# Patient Record
Sex: Female | Born: 1940 | Race: White | Hispanic: No | Marital: Single | Smoking: Former smoker
Health system: Southern US, Community
[De-identification: ages and names within clinical notes are randomized; demographics above are authoritative.]

## PROBLEM LIST (undated history)

## (undated) DIAGNOSIS — C189 Malignant neoplasm of colon, unspecified: Secondary | ICD-10-CM

## (undated) HISTORY — PX: COLON SURGERY: SHX602

---

## 2018-09-14 DIAGNOSIS — E039 Hypothyroidism, unspecified: Secondary | ICD-10-CM | POA: Insufficient documentation

## 2018-09-14 DIAGNOSIS — I10 Essential (primary) hypertension: Secondary | ICD-10-CM | POA: Insufficient documentation

## 2018-09-14 DIAGNOSIS — M109 Gout, unspecified: Secondary | ICD-10-CM | POA: Insufficient documentation

## 2018-09-14 DIAGNOSIS — Z9181 History of falling: Secondary | ICD-10-CM | POA: Insufficient documentation

## 2018-09-14 DIAGNOSIS — D126 Benign neoplasm of colon, unspecified: Secondary | ICD-10-CM | POA: Insufficient documentation

## 2018-09-28 DIAGNOSIS — C183 Malignant neoplasm of hepatic flexure: Secondary | ICD-10-CM | POA: Insufficient documentation

## 2018-10-01 DIAGNOSIS — Z8659 Personal history of other mental and behavioral disorders: Secondary | ICD-10-CM | POA: Insufficient documentation

## 2018-10-01 DIAGNOSIS — M47812 Spondylosis without myelopathy or radiculopathy, cervical region: Secondary | ICD-10-CM | POA: Insufficient documentation

## 2018-10-01 DIAGNOSIS — E669 Obesity, unspecified: Secondary | ICD-10-CM | POA: Insufficient documentation

## 2018-10-01 DIAGNOSIS — M797 Fibromyalgia: Secondary | ICD-10-CM | POA: Insufficient documentation

## 2018-10-01 DIAGNOSIS — R52 Pain, unspecified: Secondary | ICD-10-CM | POA: Insufficient documentation

## 2018-10-01 DIAGNOSIS — M4722 Other spondylosis with radiculopathy, cervical region: Secondary | ICD-10-CM | POA: Insufficient documentation

## 2018-10-01 DIAGNOSIS — G4730 Sleep apnea, unspecified: Secondary | ICD-10-CM | POA: Insufficient documentation

## 2018-10-23 ENCOUNTER — Encounter (HOSPITAL_COMMUNITY): Payer: Self-pay | Admitting: Emergency Medicine

## 2018-10-23 ENCOUNTER — Emergency Department (HOSPITAL_COMMUNITY)
Admission: EM | Admit: 2018-10-23 | Discharge: 2018-10-23 | Disposition: A | Discharge status: Home / Self Care | Payer: Medicare Other | Attending: Emergency Medicine | Admitting: Emergency Medicine

## 2018-10-23 ENCOUNTER — Emergency Department (HOSPITAL_COMMUNITY): Payer: Medicare Other

## 2018-10-23 ENCOUNTER — Other Ambulatory Visit: Payer: Self-pay

## 2018-10-23 DIAGNOSIS — R509 Fever, unspecified: Secondary | ICD-10-CM | POA: Insufficient documentation

## 2018-10-23 DIAGNOSIS — Z85038 Personal history of other malignant neoplasm of large intestine: Secondary | ICD-10-CM | POA: Insufficient documentation

## 2018-10-23 DIAGNOSIS — Z20828 Contact with and (suspected) exposure to other viral communicable diseases: Secondary | ICD-10-CM | POA: Diagnosis not present

## 2018-10-23 DIAGNOSIS — Z79899 Other long term (current) drug therapy: Secondary | ICD-10-CM | POA: Insufficient documentation

## 2018-10-23 DIAGNOSIS — M791 Myalgia, unspecified site: Secondary | ICD-10-CM | POA: Diagnosis not present

## 2018-10-23 HISTORY — DX: Malignant neoplasm of colon, unspecified: C18.9

## 2018-10-23 LAB — URINALYSIS, ROUTINE W REFLEX MICROSCOPIC
Bilirubin Urine: NEGATIVE
Glucose, UA: NEGATIVE mg/dL
Hgb urine dipstick: NEGATIVE
Ketones, ur: NEGATIVE mg/dL
Leukocytes,Ua: NEGATIVE
Nitrite: NEGATIVE
Protein, ur: NEGATIVE mg/dL
Specific Gravity, Urine: 1.008 (ref 1.005–1.030)
pH: 5 (ref 5.0–8.0)

## 2018-10-23 LAB — CBC WITH DIFFERENTIAL/PLATELET
Abs Immature Granulocytes: 0.02 10*3/uL (ref 0.00–0.07)
Basophils Absolute: 0 10*3/uL (ref 0.0–0.1)
Basophils Relative: 0 %
Eosinophils Absolute: 0 10*3/uL (ref 0.0–0.5)
Eosinophils Relative: 1 %
HCT: 31.9 % — ABNORMAL LOW (ref 36.0–46.0)
Hemoglobin: 10.3 g/dL — ABNORMAL LOW (ref 12.0–15.0)
Immature Granulocytes: 0 %
Lymphocytes Relative: 18 %
Lymphs Abs: 1.4 10*3/uL (ref 0.7–4.0)
MCH: 31.9 pg (ref 26.0–34.0)
MCHC: 32.3 g/dL (ref 30.0–36.0)
MCV: 98.8 fL (ref 80.0–100.0)
Monocytes Absolute: 0.8 10*3/uL (ref 0.1–1.0)
Monocytes Relative: 11 %
Neutro Abs: 5.5 10*3/uL (ref 1.7–7.7)
Neutrophils Relative %: 70 %
Platelets: 434 10*3/uL — ABNORMAL HIGH (ref 150–400)
RBC: 3.23 MIL/uL — ABNORMAL LOW (ref 3.87–5.11)
RDW: 13.2 % (ref 11.5–15.5)
WBC: 7.7 10*3/uL (ref 4.0–10.5)
nRBC: 0 % (ref 0.0–0.2)

## 2018-10-23 LAB — COMPREHENSIVE METABOLIC PANEL
ALT: 13 U/L (ref 0–44)
AST: 15 U/L (ref 15–41)
Albumin: 3.1 g/dL — ABNORMAL LOW (ref 3.5–5.0)
Alkaline Phosphatase: 50 U/L (ref 38–126)
Anion gap: 16 — ABNORMAL HIGH (ref 5–15)
BUN: 11 mg/dL (ref 8–23)
CO2: 21 mmol/L — ABNORMAL LOW (ref 22–32)
Calcium: 9.2 mg/dL (ref 8.9–10.3)
Chloride: 99 mmol/L (ref 98–111)
Creatinine, Ser: 0.88 mg/dL (ref 0.44–1.00)
GFR calc Af Amer: >60 mL/min (ref >60)
GFR calc non Af Amer: >60 mL/min (ref >60)
Glucose, Bld: 104 mg/dL — ABNORMAL HIGH (ref 70–99)
Potassium: 3.4 mmol/L — ABNORMAL LOW (ref 3.5–5.1)
Sodium: 136 mmol/L (ref 135–145)
Total Bilirubin: 0.6 mg/dL (ref 0.3–1.2)
Total Protein: 7 g/dL (ref 6.5–8.1)

## 2018-10-23 LAB — SARS CORONAVIRUS 2 BY RT PCR (HOSPITAL ORDER, PERFORMED IN ~~LOC~~ HOSPITAL LAB): SARS Coronavirus 2: NEGATIVE

## 2018-10-23 LAB — C-REACTIVE PROTEIN: CRP: 21.2 mg/dL — ABNORMAL HIGH (ref <1.0)

## 2018-10-23 LAB — LACTIC ACID, PLASMA
Lactic Acid, Venous: 1.4 mmol/L (ref 0.5–1.9)
Lactic Acid, Venous: 1 mmol/L (ref 0.5–1.9)

## 2018-10-23 LAB — LACTATE DEHYDROGENASE: LDH: 163 U/L (ref 98–192)

## 2018-10-23 LAB — D-DIMER, QUANTITATIVE: D-Dimer, Quant: 4.91 ug/mL-FEU — ABNORMAL HIGH (ref 0.00–0.50)

## 2018-10-23 LAB — TRIGLYCERIDES: Triglycerides: 84 mg/dL (ref <150)

## 2018-10-23 LAB — TROPONIN I: Troponin I: 0.03 ng/mL (ref <0.03)

## 2018-10-23 LAB — FERRITIN: Ferritin: 345 ng/mL — ABNORMAL HIGH (ref 11–307)

## 2018-10-23 LAB — FIBRINOGEN: Fibrinogen: 778 mg/dL — ABNORMAL HIGH (ref 210–475)

## 2018-10-23 LAB — PROCALCITONIN: Procalcitonin: <0.1 ng/mL

## 2018-10-23 MED ORDER — MORPHINE SULFATE (PF) 4 MG/ML IV SOLN
4.0000 mg | Freq: Once | INTRAVENOUS | Status: AC
  Administered 2018-10-23: 4 mg via INTRAVENOUS
  Filled 2018-10-23: qty 1

## 2018-10-23 MED ORDER — SODIUM CHLORIDE 0.9 % IV BOLUS
500.0000 mL | Freq: Once | INTRAVENOUS | Status: AC
  Administered 2018-10-23: 15:00:00 500 mL via INTRAVENOUS

## 2018-10-23 MED ORDER — SODIUM CHLORIDE 0.9 % IV SOLN: 1000.0000 mL | INTRAVENOUS | Status: DC

## 2018-10-23 MED ORDER — FUROSEMIDE 40 MG PO TABS
20.0000 mg | ORAL_TABLET | Freq: Every day | ORAL | Status: DC
  Administered 2018-10-23: 20 mg via ORAL
  Filled 2018-10-23: qty 1

## 2018-10-23 NOTE — ED Notes (Signed)
[facility] called to transport patient back to [facility]

## 2018-10-23 NOTE — ED Provider Notes (Signed)
[facility] EMERGENCY DEPARTMENT Provider Note   CSN: [id number] Arrival date & time: 10/23/18  1447    History   Chief Complaint Chief Complaint  Patient presents with  . Generalized edema    HPI Deborah Lane is a 78 y.o. female.  She was sent in from home by her visiting nurse for evaluation of fever.  She has a history of colon cancer and said she had a partial resection done at [facility] about 2 weeks ago.  She has been doing well from that and was staying with her son.  She said starting yesterday she began with myalgias and arthralgias.  No cough no shortness of breath no nausea vomiting or diarrhea.  She said she is had a little bit of urinary frequency.  No known Covid exposure.  She is tried nothing for it.     The history is provided by the patient.  Fever  Severity:  Unable to specify Onset quality:  Gradual Progression:  Unchanged Chronicity:  New Relieved by:  None tried Worsened by:  Movement Ineffective treatments:  None tried Associated symptoms: myalgias   Associated symptoms: no chills, no confusion, no congestion, no cough, no diarrhea, no dysuria, no ear pain, no headaches, no nausea, no rash, no rhinorrhea, no somnolence, no sore throat and no vomiting     No past medical history on file.  There are no active problems to display for this patient.   Past Surgical History:  Procedure Laterality Date  . COLON SURGERY       OB History   No obstetric history on file.      Home Medications    Prior to Admission medications   Medication Sig Start Date End Date Taking? Authorizing Provider  acetaminophen (TYLENOL) 325 MG tablet Take 325 mg by mouth daily as needed for mild pain.  10/18/18 10/28/18 Yes [provider]  allopurinol (ZYLOPRIM) 100 MG tablet Take 100 mg by mouth daily.  08/27/18  Yes [provider]  enoxaparin (LOVENOX) 40 MG/0.4ML injection Inject 40 mg into the skin. Every 21 days 10/19/18 11/09/18 Yes [provider]   furosemide (LASIX) 40 MG tablet Take 20 mg by mouth daily.    Yes [provider]  levothyroxine (SYNTHROID) 100 MCG tablet Take 100 mcg by mouth daily before breakfast.    Yes [provider]  lisinopril-hydrochlorothiazide (ZESTORETIC) 20-25 MG tablet Take 1 tablet by mouth daily.    Yes [provider]  polyethylene glycol (MIRALAX / GLYCOLAX) 17 g packet Take 17 g by mouth daily.    Yes [provider]  potassium chloride SA (K-DUR) 20 MEQ tablet Take 40 mEq by mouth daily.    Yes [provider]  senna-docusate (SENOKOT-S) 8.6-50 MG tablet Take 1 tablet by mouth daily.  10/18/18  Yes [provider]  simvastatin (ZOCOR) 40 MG tablet Take 40 mg by mouth daily at 6 PM.    Yes [provider]    Family History No family history on file.  Social History Social History   Tobacco Use  . Smoking status: Not on file  Substance Use Topics  . Alcohol use: Not on file  . Drug use: Not on file     Allergies   Patient has no allergy information on record.   Review of Systems Review of Systems  Constitutional: Positive for fever. Negative for chills.  HENT: Negative for congestion, ear pain, rhinorrhea and sore throat.   Eyes: Negative for visual disturbance.  Respiratory: Negative for cough.   Gastrointestinal: Negative for diarrhea, nausea and vomiting.  Genitourinary: Negative for dysuria.  Musculoskeletal: Positive for myalgias.  Skin: Negative for rash.  Neurological: Negative for headaches.  Psychiatric/Behavioral: Negative for confusion.     Physical Exam Updated Vital Signs There were no vitals taken for this visit.  Physical Exam Vitals signs and nursing note reviewed.  Constitutional:      General: She is not in acute distress.    Appearance: Normal appearance. She is well-developed.  HENT:     Head: Normocephalic and atraumatic.  Eyes:     Conjunctiva/sclera: Conjunctivae normal.  Neck:      Musculoskeletal: Neck supple.  Cardiovascular:     Rate and Rhythm: Normal rate and regular rhythm.     Heart sounds: No murmur.  Pulmonary:     Effort: Pulmonary effort is normal. No respiratory distress.     Breath sounds: Normal breath sounds.  Abdominal:     Palpations: Abdomen is soft.     Tenderness: There is no abdominal tenderness.  Musculoskeletal: Normal range of motion.        General: Tenderness (diffuse whole body) present. No deformity.  Skin:    General: Skin is warm and dry.     Capillary Refill: Capillary refill takes less than 2 seconds.  Neurological:     Mental Status: She is alert.     Sensory: No sensory deficit.     Motor: No weakness.      ED Treatments / Results  Labs (all labs ordered are listed, but only abnormal results are displayed) Labs Reviewed  COMPREHENSIVE METABOLIC PANEL - Abnormal; Notable for the following components:      Result Value   Potassium 3.4 (*)    CO2 21 (*)    Glucose, Bld 104 (*)    Albumin 3.1 (*)    Anion gap 16 (*)    All other components within normal limits  FERRITIN - Abnormal; Notable for the following components:   Ferritin 345 (*)    All other components within normal limits  C-REACTIVE PROTEIN - Abnormal; Notable for the following components:   CRP 21.2 (*)    All other components within normal limits  D-DIMER, QUANTITATIVE (NOT AT [facility]) - Abnormal; Notable for the following components:   D-Dimer, Quant 4.91 (*)    All other components within normal limits  FIBRINOGEN - Abnormal; Notable for the following components:   Fibrinogen 778 (*)    All other components within normal limits  CBC WITH DIFFERENTIAL/PLATELET - Abnormal; Notable for the following components:   RBC 3.23 (*)    Hemoglobin 10.3 (*)    HCT 31.9 (*)    Platelets 434 (*)    All other components within normal limits  TROPONIN I - Abnormal; Notable for the following components:   Troponin I 0.03 (*)    All other components within normal  limits  SARS CORONAVIRUS 2 (HOSPITAL ORDER, [facility])  CULTURE, BLOOD (ROUTINE X 2)  LACTIC ACID, PLASMA  LACTIC ACID, PLASMA  PROCALCITONIN  LACTATE DEHYDROGENASE  TRIGLYCERIDES  URINALYSIS, ROUTINE W REFLEX MICROSCOPIC  CBC WITH DIFFERENTIAL/PLATELET    EKG EKG Interpretation  Date/Time:  Friday Oct 23 2018 14:51:38 EDT Ventricular Rate:  95 PR Interval:    QRS Duration: 147 QT Interval:  385 QTC Calculation: 484 R Axis:   36 Text Interpretation:  Sinus rhythm Atrial premature complex Left bundle branch block no prior to compare with  Confirmed by Aletta Edouard [phone]) on 10/23/2018 2:53:28 PM   Radiology Dg Chest Port 1 View  Result Date: 10/23/2018 CLINICAL DATA:  Fever, edema, recent colon surgery 10/06/2018 EXAM: PORTABLE CHEST 1 VIEW COMPARISON:  None available FINDINGS: Cardiomegaly noted with vascular congestion. Low lung volumes and mild basilar atelectasis. No large effusion or pneumothorax. No significant collapse or consolidation. Aorta atherosclerotic. Degenerative changes noted spine. IMPRESSION: Cardiomegaly with vascular congestion Low lung volumes and basilar atelectasis Thoracic atherosclerosis Electronically Signed   By: Jerilynn Mages.  Shick M.D.   On: 10/23/2018 16:28    Procedures Procedures (including critical care time)  Medications Ordered in ED Medications  morphine 4 MG/ML injection 4 mg (4 mg Intravenous Given 10/23/18 1525)  sodium chloride 0.9 % bolus 500 mL (0 mLs Intravenous Stopped 10/23/18 1816)     Initial Impression / Assessment and Plan / ED Course  I have reviewed the triage vital signs and the nursing notes.  Pertinent labs & imaging results that were available during my care of the patient were reviewed by me and considered in my medical decision making (see chart for details).  Clinical Course as of Oct 23 1200  Fri Oct 22, 7873  8751 78 year old female with colon cancer and recent resection here with fever to  101.3 noted by visiting nurse.  Patient herself is complaining of diffuse pain.  Differential diagnosis includes Covid, sepsis, pneumonia, UTI, postop infection.    [MB]  0932 Patient states she is feeling better and was hoping to go home.  Her work-up for fever is been fairly unremarkable although she did not get a full set of labs due to her poor access.  Her lactate was 1.4.  Her urinalysis is negative.  Covid negative.  Chest x-ray showing some signs of cardiomegaly fluid but no pneumonia.  Would like to get a CBC on her and troponin as that was part of the initial work-up.   [MB]  1934 Patient's hemoglobin is low here at 10.3 but on review of prior discharge hemoglobin at [facility] she was 9.7 on the 15th.   [MB]  1953 They were able to get some more blood on the patient and her d-dimer is elevated at 4.  9, although she has known cancer and is recently postop.  She is not complaining of chest pain or shortness of breath so I do not feel that she needs PE pursued.  This test was drawn in the setting of Covid testing for inflammatory markers.   [MB]  2018 Patient's troponin was mildly elevated at 0.03 here.  She denies any chest pain and this test was sent 4 hours after arrival.  Feel that she can be discharged and the nurses can reach out to her son to pick her up.   [MB]    Clinical Course User Index [MB] Hayden Rasmussen, MD   Shanele Nissan was evaluated in Emergency Department on 10/23/2018 for the symptoms described in the history of present illness. She was evaluated in the context of the global COVID-19 pandemic, which necessitated consideration that the patient might be at risk for infection with the SARS-CoV-2 virus that causes COVID-19. Institutional protocols and algorithms that pertain to the evaluation of patients at risk for COVID-19 are in a state of rapid change based on information released by regulatory bodies including the CDC and federal and state organizations. These policies and  algorithms were followed during the patient's care in the ED.      Final Clinical Impressions(s) /  ED Diagnoses   Final diagnoses:  Fever in adult  Myalgia    ED Discharge Orders    None       Hayden Rasmussen, MD 10/24/18 1203

## 2018-10-23 NOTE — ED Notes (Signed)
Date and time results received: 10/23/18 2016 (use smartphrase ".now" to insert current time)  Test: troponin Critical Value: 0.03  Name of Provider Notified: Butler,MD  Orders Received? Or Actions Taken?:

## 2018-10-23 NOTE — Discharge Instructions (Signed)
You are seen in the emergency department for fever and body aches.  You had blood work chest x-ray EKG and urinalysis that did not show an obvious explanation for your symptoms.  There was no signs of pneumonia or urinary tract infection and your Covid test was negative.  Please monitor yourself for fever.  Continue your regular medications.  Follow-up with your doctor and return if any worsening symptoms.

## 2018-10-23 NOTE — ED Triage Notes (Signed)
Patient from home via EMS. Patient complains of generalized edema and pain. States colon surgery at [facility] 10/06/2018. Home health nurse states patient has temperature of 101.3 at home and requested patient sent to ED for evaluation. Patient is alert and oriented x 4 in triage.

## 2018-10-24 ENCOUNTER — Encounter (HOSPITAL_COMMUNITY): Payer: Self-pay | Admitting: Emergency Medicine

## 2018-10-28 LAB — CULTURE, BLOOD (ROUTINE X 2)
Culture: NO GROWTH
Special Requests: ADEQUATE

## 2019-09-12 IMAGING — CR PORTABLE CHEST - 1 VIEW
1 series · 1 of 1 positions shown · non-contrast
Comparison: None available

CLINICAL DATA: Fever, edema, recent colon surgery 10/06/2018

EXAM:
PORTABLE CHEST 1 VIEW

[portable]
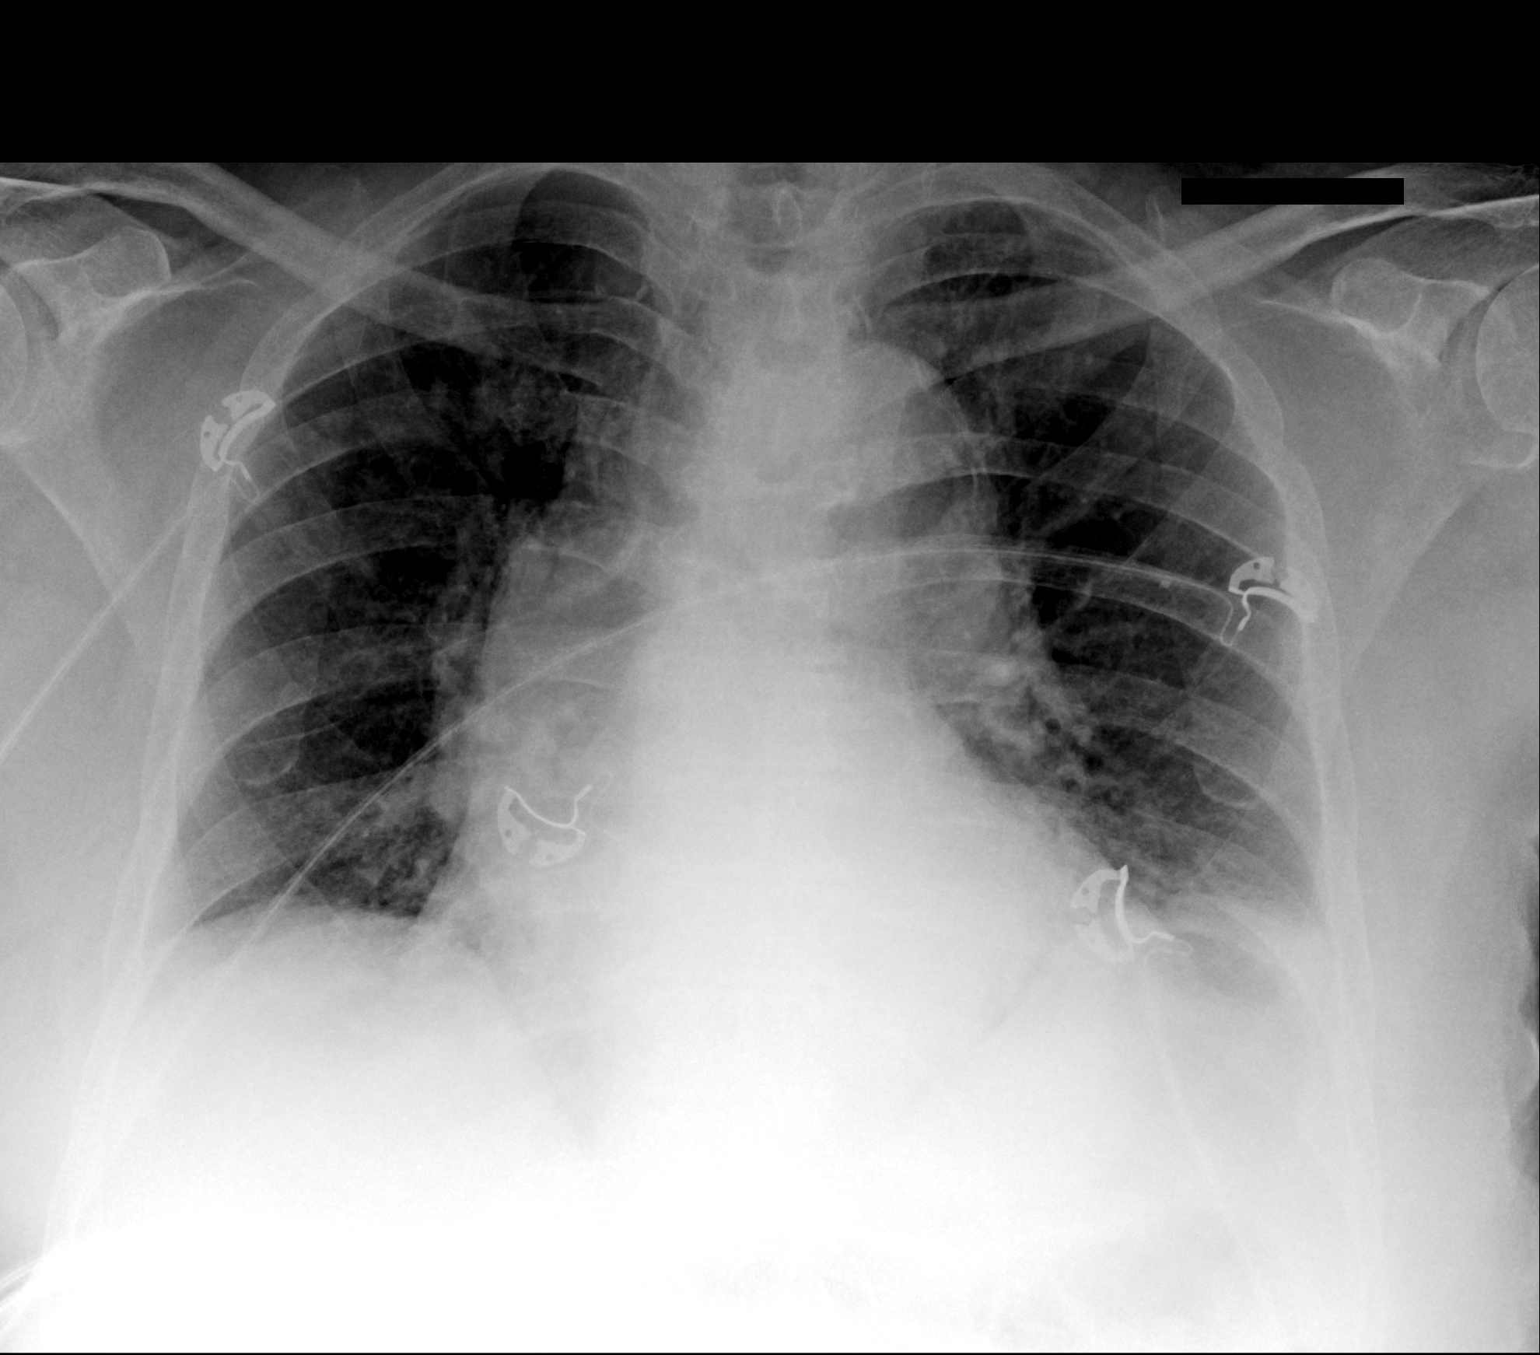

[1 of 1 positions shown; findings below may reference images not displayed]

FINDINGS: Cardiomegaly noted with vascular congestion. Low lung volumes and
mild basilar atelectasis. No large effusion or pneumothorax. No
significant collapse or consolidation. Aorta atherosclerotic.
Degenerative changes noted spine.
IMPRESSION: Cardiomegaly with vascular congestion

Low lung volumes and basilar atelectasis

Thoracic atherosclerosis

## 2020-06-07 DIAGNOSIS — M1990 Unspecified osteoarthritis, unspecified site: Secondary | ICD-10-CM | POA: Insufficient documentation

## 2020-06-07 DIAGNOSIS — M797 Fibromyalgia: Secondary | ICD-10-CM | POA: Insufficient documentation

## 2020-06-07 DIAGNOSIS — R600 Localized edema: Secondary | ICD-10-CM | POA: Insufficient documentation

## 2020-06-07 DIAGNOSIS — R809 Proteinuria, unspecified: Secondary | ICD-10-CM | POA: Insufficient documentation

## 2020-06-07 DIAGNOSIS — E876 Hypokalemia: Secondary | ICD-10-CM | POA: Insufficient documentation

## 2020-06-07 DIAGNOSIS — E559 Vitamin D deficiency, unspecified: Secondary | ICD-10-CM | POA: Insufficient documentation

## 2020-06-07 DIAGNOSIS — E782 Mixed hyperlipidemia: Secondary | ICD-10-CM | POA: Insufficient documentation

## 2020-06-07 DIAGNOSIS — M159 Polyosteoarthritis, unspecified: Secondary | ICD-10-CM | POA: Insufficient documentation

## 2020-09-21 ENCOUNTER — Emergency Department (HOSPITAL_COMMUNITY): Payer: Medicare (Managed Care)

## 2020-09-21 ENCOUNTER — Encounter (HOSPITAL_COMMUNITY): Payer: Self-pay | Admitting: *Deleted

## 2020-09-21 ENCOUNTER — Other Ambulatory Visit: Payer: Self-pay

## 2020-09-21 ENCOUNTER — Emergency Department (HOSPITAL_COMMUNITY)
Admission: EM | Admit: 2020-09-21 | Discharge: 2020-09-21 | Disposition: A | Discharge status: Home / Self Care | Payer: Medicare (Managed Care) | Attending: Emergency Medicine | Admitting: Emergency Medicine

## 2020-09-21 DIAGNOSIS — R221 Localized swelling, mass and lump, neck: Secondary | ICD-10-CM | POA: Diagnosis not present

## 2020-09-21 DIAGNOSIS — R42 Dizziness and giddiness: Secondary | ICD-10-CM | POA: Insufficient documentation

## 2020-09-21 DIAGNOSIS — Z85038 Personal history of other malignant neoplasm of large intestine: Secondary | ICD-10-CM | POA: Diagnosis not present

## 2020-09-21 DIAGNOSIS — Z87891 Personal history of nicotine dependence: Secondary | ICD-10-CM | POA: Insufficient documentation

## 2020-09-21 DIAGNOSIS — E860 Dehydration: Secondary | ICD-10-CM | POA: Diagnosis not present

## 2020-09-21 LAB — URINALYSIS, ROUTINE W REFLEX MICROSCOPIC
Bilirubin Urine: NEGATIVE
Glucose, UA: NEGATIVE mg/dL
Hgb urine dipstick: NEGATIVE
Ketones, ur: NEGATIVE mg/dL
Nitrite: NEGATIVE
Protein, ur: NEGATIVE mg/dL
Specific Gravity, Urine: 1.027 (ref 1.005–1.030)
pH: 6 (ref 5.0–8.0)

## 2020-09-21 LAB — CBC
HCT: 40.9 % (ref 36.0–46.0)
Hemoglobin: 13.4 g/dL (ref 12.0–15.0)
MCH: 33.1 pg (ref 26.0–34.0)
MCHC: 32.8 g/dL (ref 30.0–36.0)
MCV: 101 fL — ABNORMAL HIGH (ref 80.0–100.0)
Platelets: 253 10*3/uL (ref 150–400)
RBC: 4.05 MIL/uL (ref 3.87–5.11)
RDW: 13 % (ref 11.5–15.5)
WBC: 4.1 10*3/uL (ref 4.0–10.5)
nRBC: 0 % (ref 0.0–0.2)

## 2020-09-21 LAB — TROPONIN I (HIGH SENSITIVITY): Troponin I (High Sensitivity): 11 ng/L (ref <18)

## 2020-09-21 LAB — BASIC METABOLIC PANEL
Anion gap: 9 (ref 5–15)
BUN: 25 mg/dL — ABNORMAL HIGH (ref 8–23)
CO2: 30 mmol/L (ref 22–32)
Calcium: 10.1 mg/dL (ref 8.9–10.3)
Chloride: 100 mmol/L (ref 98–111)
Creatinine, Ser: 1.19 mg/dL — ABNORMAL HIGH (ref 0.44–1.00)
GFR, Estimated: 46 mL/min — ABNORMAL LOW (ref >60)
Glucose, Bld: 123 mg/dL — ABNORMAL HIGH (ref 70–99)
Potassium: 3.6 mmol/L (ref 3.5–5.1)
Sodium: 139 mmol/L (ref 135–145)

## 2020-09-21 MED ORDER — SODIUM CHLORIDE 0.9 % IV BOLUS
1000.0000 mL | Freq: Once | INTRAVENOUS | Status: AC
  Administered 2020-09-21: 1000 mL via INTRAVENOUS

## 2020-09-21 MED ORDER — MECLIZINE HCL 12.5 MG PO TABS
25.0000 mg | ORAL_TABLET | Freq: Once | ORAL | Status: AC
  Administered 2020-09-21: 25 mg via ORAL
  Filled 2020-09-21: qty 2

## 2020-09-21 MED ORDER — DIAZEPAM 2 MG PO TABS
2.0000 mg | ORAL_TABLET | Freq: Once | ORAL | Status: AC
  Administered 2020-09-21: 2 mg via ORAL
  Filled 2020-09-21: qty 1

## 2020-09-21 MED ORDER — MECLIZINE HCL 25 MG PO TABS: 25.0000 mg | ORAL_TABLET | Freq: Three times a day (TID) | ORAL | 0 refills | Status: DC | PRN

## 2020-09-21 MED ORDER — GADOBUTROL 1 MMOL/ML IV SOLN
10.0000 mL | Freq: Once | INTRAVENOUS | Status: AC | PRN
  Administered 2020-09-21: 10 mL via INTRAVENOUS

## 2020-09-21 MED ORDER — DIAZEPAM 2 MG PO TABS: 2.0000 mg | ORAL_TABLET | Freq: Three times a day (TID) | ORAL | 0 refills | Status: AC | PRN

## 2020-09-21 NOTE — Discharge Instructions (Signed)
If you develop new or worsening headache, uncontrolled dizziness, inability to walk, fever, vomiting, or any other new/concerning symptoms then return to the ER for evaluation.

## 2020-09-21 NOTE — ED Triage Notes (Signed)
Emergency Medicine Provider Triage Evaluation Note  Deborah Lane , a 80 y.o. female  was evaluated in triage.  Pt complains of left-sided head dizziness.  Been present for 10 days.  Describes it as feeling lightheaded like she is going to pass out.  It is worse with positioning such as standing or movement of her head.  It is intermittent.  No fevers or infectious symptoms.  She was recently started on antibiotics for presumed UTI.Marland Kitchen  Review of Systems  Positive: Dizziness, lightheadedness Negative: Fever, cp  Physical Exam  BP (!) 150/72 (BP Location: Right Arm)   Pulse 72   Temp 99.4 F (37.4 C) (Oral)   Resp 20   SpO2 97%  Gen:   Awake, no distress   HEENT:  Atraumatic Resp:  Normal effort  Cardiac:  Normal rate  Abd:   Nondistended, nontender  MSK:   Moves extremities without difficulty  Neuro:  Speech clear.  CN intact.  Nose to finger intact.  Strength and sensation intact x4  Medical Decision Making  Medically screening exam initiated at 4:37 PM.  Appropriate orders placed.  Deborah Lane was informed that the remainder of the evaluation will be completed by another provider, this initial triage assessment does not replace that evaluation, and the importance of remaining in the ED until their evaluation is complete.  Clinical Impression   Patient with dizziness which she describes a light as lightheadedness.  Worse with position changes.  Likely peripheral vertigo.  Will obtain labs to ensure no metabolic abnormality.   Franchot Heidelberg, PA-C 09/21/20 1638

## 2020-09-21 NOTE — ED Triage Notes (Signed)
C/o dizziness for a couple of weeks

## 2020-09-21 NOTE — ED Provider Notes (Signed)
[facility] EMERGENCY DEPARTMENT Provider Note   CSN: [id number] Arrival date & time: 09/21/20  1533     History Chief Complaint  Patient presents with  . Dizziness    Deborah Lane is a 80 y.o. female.  HPI 80 year old female presents with dizziness.  This has been ongoing for about 10 days.  She states she originally went to an outside hospital where she was given meclizine, a total of about 5 doses, which seemed to help but has been run out.  The dizziness seems to be pretty much constant but worse with movement.  Sometimes it feels like her eyes are twitching.  She has chronic headaches but no new or worse headache.  She also has been having chronic left-sided neck pain for about a year.  Has chronic left-sided ringing.  She has noticed that her left leg seems to be weaker over the same period of time.  She is able to ambulate with a cane.  No numbness.  Has chronic on and off chest pain that most recently had today.  No fevers.  No vomiting. She describes the dizziness as feeling like she's going to pass out. However she also tells me that she feels drunk.   Past Medical History:  Diagnosis Date  . Colon cancer ([place])     There are no problems to display for this patient.   Past Surgical History:  Procedure Laterality Date  . COLON SURGERY       OB History   No obstetric history on file.     No family history on file.  Social History   Tobacco Use  . Smoking status: Former Research scientist (life sciences)  . Smokeless tobacco: Never Used  Substance Use Topics  . Alcohol use: Not Currently  . Drug use: Never    Home Medications Prior to Admission medications   Medication Sig Start Date End Date Taking? Authorizing Provider  diazepam (VALIUM) 2 MG tablet Take 1 tablet (2 mg total) by mouth every 8 (eight) hours as needed (dizziness). 09/21/20  Yes Sherwood Gambler, MD  meclizine (ANTIVERT) 25 MG tablet Take 1 tablet (25 mg total) by mouth 3 (three) times daily as needed for dizziness.  09/21/20  Yes Sherwood Gambler, MD  allopurinol (ZYLOPRIM) 100 MG tablet Take 100 mg by mouth daily.  08/27/18   [provider]  enoxaparin (LOVENOX) 40 MG/0.4ML injection Inject 40 mg into the skin. Every 21 days 10/19/18 11/09/18  [provider]  furosemide (LASIX) 40 MG tablet Take 20 mg by mouth daily.     [provider]  levothyroxine (SYNTHROID) 100 MCG tablet Take 100 mcg by mouth daily before breakfast.     [provider]  lisinopril-hydrochlorothiazide (ZESTORETIC) 20-25 MG tablet Take 1 tablet by mouth daily.     [provider]  polyethylene glycol (MIRALAX / GLYCOLAX) 17 g packet Take 17 g by mouth daily.     [provider]  potassium chloride SA (K-DUR) 20 MEQ tablet Take 40 mEq by mouth daily.     [provider]  senna-docusate (SENOKOT-S) 8.6-50 MG tablet Take 1 tablet by mouth daily.  10/18/18   [provider]  simvastatin (ZOCOR) 40 MG tablet Take 40 mg by mouth daily at 6 PM.     [provider]    Allergies    Patient has no known allergies.  Review of Systems   Review of Systems  Constitutional: Negative for fever.  Eyes: Positive for visual disturbance (spots  in her eyes for about 1 month, intermittent).  Respiratory: Negative for shortness of breath.   Cardiovascular: Positive for chest pain.  Gastrointestinal: Negative for abdominal pain and vomiting.  Musculoskeletal: Positive for neck pain.  Neurological: Positive for dizziness, weakness, light-headedness and headaches. Negative for numbness.  All other systems reviewed and are negative.   Physical Exam Updated Vital Signs BP 131/76   Pulse 70   Temp 98 F (36.7 C) (Oral)   Resp 15   Ht 5\' 4"  (1.626 m)   Wt 95.3 kg   SpO2 94%   BMI 36.05 kg/m   Physical Exam Vitals and nursing note reviewed.  Constitutional:      Appearance: She is well-developed. She is not ill-appearing or diaphoretic.  HENT:     Head: Normocephalic  and atraumatic.     Right Ear: External ear normal.     Left Ear: External ear normal.     Nose: Nose normal.  Eyes:     General:        Right eye: No discharge.        Left eye: No discharge.     Pupils: Pupils are equal, round, and reactive to light.     Comments: Perhaps mild right sided nystagmus  Neck:     Comments: Small subcutaneous nodule on left posterior neck, doesn't seem tender Cardiovascular:     Rate and Rhythm: Normal rate and regular rhythm.     Heart sounds: Normal heart sounds.  Pulmonary:     Effort: Pulmonary effort is normal.     Breath sounds: Normal breath sounds.  Abdominal:     Palpations: Abdomen is soft.     Tenderness: There is no abdominal tenderness.  Musculoskeletal:     Cervical back: Neck supple. No rigidity.  Skin:    General: Skin is warm and dry.  Neurological:     Mental Status: She is alert.     Comments: CN 3-12 grossly intact. 5/5 strength in bilateral upper extremities. She has weak legs diffusely (barely holds them off the stretcher) but the left seems weaker than right. Grossly normal sensation. Normal finger to nose.   Psychiatric:        Mood and Affect: Mood is not anxious.     ED Results / Procedures / Treatments   Labs (all labs ordered are listed, but only abnormal results are displayed) Labs Reviewed  BASIC METABOLIC PANEL - Abnormal; Notable for the following components:      Result Value   Glucose, Bld 123 (*)    BUN 25 (*)    Creatinine, Ser 1.19 (*)    GFR, Estimated 46 (*)    All other components within normal limits  CBC - Abnormal; Notable for the following components:   MCV 101.0 (*)    All other components within normal limits  URINALYSIS, ROUTINE W REFLEX MICROSCOPIC - Abnormal; Notable for the following components:   APPearance HAZY (*)    Leukocytes,Ua MODERATE (*)    Bacteria, UA RARE (*)    All other components within normal limits  CBG MONITORING, ED  TROPONIN I (HIGH SENSITIVITY)    EKG EKG  Interpretation  Date/Time:  Thursday September 21 2020 16:07:56 EDT Ventricular Rate:  74 PR Interval:  164 QRS Duration: 152 QT Interval:  442 QTC Calculation: 490 R Axis:   56 Text Interpretation: Sinus rhythm with Fusion complexes Left bundle branch block similar to May 2020 Confirmed by Sherwood Gambler [phone]) on 09/21/2020 4:12:30 PM  Radiology MR Brain W and Wo Contrast  Result Date: 09/21/2020 CLINICAL DATA:  Dizziness, persistent/recurrent, cardiac or vascular cause suspected. Additional history provided: Patient reports left-sided "head dizziness." Symptoms for 10 days. Patient describes feeling as lightheadedness as if she is going to pass out. Symptoms worse with positioning such as standing or movement of head. History of colon cancer. EXAM: MRI HEAD WITHOUT AND WITH CONTRAST TECHNIQUE: Multiplanar, multiecho pulse sequences of the brain and surrounding structures were obtained without and with intravenous contrast. CONTRAST:  56mL GADAVIST GADOBUTROL 1 MMOL/ML IV SOLN COMPARISON:  No pertinent prior exams available for comparison. FINDINGS: Brain: The examination is intermittently motion degraded. Most notably, there is moderate motion degradation of the axial T2/FLAIR sequence, moderate motion degradation of the axial T2* sequence, moderate motion degradation of the coronal T2 weighted sequence, severe motion degradation of the coronal T1 weighted postcontrast sequence and severe motion degradation of the sagittal T1 weighted postcontrast sequence. Moderate to moderately advanced cerebral atrophy. Comparatively mild cerebellar atrophy. Moderate multifocal T2/FLAIR hyperintensity within the cerebral white matter which is nonspecific, but compatible with chronic small vessel ischemic disease. There is no acute infarct. No evidence of intracranial mass. No chronic intracranial blood products. No extra-axial fluid collection. No midline shift. Partially empty sella turcica. Within the  limitations of motion degradation, no abnormal intracranial enhancement is identified. Vascular: Expected proximal arterial flow voids. Skull and upper cervical spine: No focal marrow lesion. Incompletely assessed cervical spondylosis. Susceptibility artifact arising from cervical spinal fusion hardware. Sinuses/Orbits: Visualized orbits show no acute finding. Trace bilateral ethmoid sinus mucosal thickening. IMPRESSION: Multiple sequences are significantly motion degraded, as described and limiting evaluation. The diffusion-weighted imaging is of good quality. No evidence of acute or recent subacute infarction. No evidence of acute intracranial abnormality. 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 Moderate to moderately advanced cerebral atrophy. Comparatively mild cerebellar atrophy. Electronically Signed   By: Kellie Simmering DO   On: 09/21/2020 19:12   DG Chest Portable 1 View  Result Date: 09/21/2020 CLINICAL DATA:  Chest pain EXAM: PORTABLE CHEST 1 VIEW COMPARISON:  10/23/2018 FINDINGS: Cardiomegaly. No confluent airspace opacities, effusions or edema. No acute bony abnormality. IMPRESSION: Cardiomegaly.  No active disease. Electronically Signed   By: Rolm Baptise M.D.   On: 09/21/2020 18:28    Procedures Procedures   Medications Ordered in ED Medications  sodium chloride 0.9 % bolus 1,000 mL (0 mLs Intravenous Stopped 09/21/20 1905)  meclizine (ANTIVERT) tablet 25 mg (25 mg Oral Given 09/21/20 1745)  gadobutrol (GADAVIST) 1 MMOL/ML injection 10 mL (10 mLs Intravenous Contrast Given 09/21/20 1833)  diazepam (VALIUM) tablet 2 mg (2 mg Oral Given 09/21/20 2054)    ED Course  I have reviewed the triage vital signs and the nursing notes.  Pertinent labs & imaging results that were available during my care of the patient were reviewed by me and considered in my medical decision making (see chart for details).    MDM Rules/Calculators/A&P                           Patient's dizziness sounds like vertigo. Partial improvement with meclizine.  Given her feeling like her left leg is weaker, an MRI was obtained and does not show an obvious stroke.  Her labs do indicate some mild dehydration and she was given fluids.  Her urine is contaminated and given no UTI symptoms I do not think this represents infection.  She was  unable to get up and walk besides a couple steps and so she was given Valium as well.  Then she was rewalked and able to ambulate without difficulty.  This sounds like a peripheral vertigo.  Will give Antivert and Valium as an Rx and have her follow-up with PCP and ENT.  Given return precautions Final Clinical Impression(s) / ED Diagnoses Final diagnoses:  Vertigo  Dehydration    Rx / DC Orders ED Discharge Orders         Ordered    diazepam (VALIUM) 2 MG tablet  Every 8 hours PRN        09/21/20 2234    meclizine (ANTIVERT) 25 MG tablet  3 times daily PRN        09/21/20 2234           Sherwood Gambler, MD 09/21/20 2321

## 2020-09-21 NOTE — ED Notes (Signed)
Pt ambulated at this time. She was able to ambulate a few steps with the cane and nurse assistance however leaned heavily on the nurses assistance. Upon first sitting up immediately felt dizzy and laid back once again.

## 2021-11-07 ENCOUNTER — Encounter: Payer: Self-pay | Admitting: Family Medicine

## 2021-11-07 ENCOUNTER — Ambulatory Visit (INDEPENDENT_AMBULATORY_CARE_PROVIDER_SITE_OTHER): Payer: Medicare HMO | Admitting: Family Medicine

## 2021-11-07 VITALS — BP 147/74 | HR 73 | Ht 64.0 in | Wt 198.6 lb

## 2021-11-07 DIAGNOSIS — R7301 Impaired fasting glucose: Secondary | ICD-10-CM

## 2021-11-07 DIAGNOSIS — M19211 Secondary osteoarthritis, right shoulder: Secondary | ICD-10-CM | POA: Diagnosis not present

## 2021-11-07 DIAGNOSIS — I10 Essential (primary) hypertension: Secondary | ICD-10-CM | POA: Insufficient documentation

## 2021-11-07 DIAGNOSIS — M25474 Effusion, right foot: Secondary | ICD-10-CM | POA: Diagnosis not present

## 2021-11-07 DIAGNOSIS — L0213 Carbuncle of neck: Secondary | ICD-10-CM | POA: Diagnosis not present

## 2021-11-07 DIAGNOSIS — M25471 Effusion, right ankle: Secondary | ICD-10-CM | POA: Insufficient documentation

## 2021-11-07 DIAGNOSIS — N1831 Chronic kidney disease, stage 3a: Secondary | ICD-10-CM | POA: Insufficient documentation

## 2021-11-07 DIAGNOSIS — M25475 Effusion, left foot: Secondary | ICD-10-CM

## 2021-11-07 DIAGNOSIS — M25472 Effusion, left ankle: Secondary | ICD-10-CM | POA: Diagnosis not present

## 2021-11-07 DIAGNOSIS — Z13820 Encounter for screening for osteoporosis: Secondary | ICD-10-CM

## 2021-11-07 DIAGNOSIS — E039 Hypothyroidism, unspecified: Secondary | ICD-10-CM | POA: Diagnosis not present

## 2021-11-07 DIAGNOSIS — Z0001 Encounter for general adult medical examination with abnormal findings: Secondary | ICD-10-CM

## 2021-11-07 DIAGNOSIS — E559 Vitamin D deficiency, unspecified: Secondary | ICD-10-CM | POA: Diagnosis not present

## 2021-11-07 DIAGNOSIS — M1990 Unspecified osteoarthritis, unspecified site: Secondary | ICD-10-CM | POA: Diagnosis not present

## 2021-11-07 MED ORDER — MUPIROCIN 2 % EX OINT: 1.0000 "application " | TOPICAL_OINTMENT | Freq: Two times a day (BID) | CUTANEOUS | 0 refills | Status: DC

## 2021-11-07 MED ORDER — DICLOFENAC SODIUM 1 % EX GEL: 4.0000 g | Freq: Four times a day (QID) | CUTANEOUS | 1 refills | Status: DC

## 2021-11-07 NOTE — Assessment & Plan Note (Signed)
-  patient is complaint with diuretics treatments   -Recommend conservative managements to help decrease swelling in your  Feet and ankles.   -Reduce salt (sodium) in your diet -- Sodium, which is found in table salt and processed foods, can worsen edema. Reducing the amount of salt you consume can help to reduce edema, especially if you also take a diuretic.  -Compression stockings -- Leg edema can be prevented and treated with the use of compression stockings. Stockings are available in several heights, including knee-high, thigh-high, and pantyhose. Knee-high stockings are sufficient for most patients. - Body positioning -- Leg, ankle, and foot edema can be improved by elevating the legs above heart level for 30 minutes three or four times per day. Elevating the legs may be sufficient to reduce or eliminate edema

## 2021-11-07 NOTE — Patient Instructions (Addendum)
I appreciate the opportunity to provide care to you today!    Follow up:  3 months  Labs: please stop by the lab today to get your blood drawn (CBC, CMP, TSH, Lipid profile, HgA1c, Vit D)   Please pick up your medications at the pharmacy     Please continue to a heart-healthy diet and increase your physical activities. Try to exercise for 30mins at least three times a week.      It was a pleasure to see you and I look forward to continuing to work together on your health and well-being. Please do not hesitate to call the office if you need care or have questions about your care.   Have a wonderful day and week. With Gratitude, Lasheka Kempner MSN, FNP-BC  

## 2021-11-07 NOTE — Assessment & Plan Note (Signed)
-  pt is not a candidate for oral NSAID treatments -will order topical NSAID instead and encourage the use of Tylenol

## 2021-11-07 NOTE — Assessment & Plan Note (Signed)
-   assessment findings consistent with carbuncle -will send topical antibiotic ointment to apply when lesions rupture

## 2021-11-07 NOTE — Progress Notes (Addendum)
New Patient Office Visit  Subjective:  Patient ID: Deborah Lane, female    DOB: 1941-01-03  Age: 81 y.o. MRN: [id number]  CC:  Chief Complaint  Patient presents with   New Patient (Initial Visit)    Establish care,R shoulder pain mid back pain,R ankle pain, patient states its all arthritis pain.    HPI Deborah Lane is a 81 y.o. female with past medical history of chronic kidney disease and arthritis presents for establishing care. She moved from [place] to [place] 05/2021.  Arthritis: reports chronic arthritis pain in the right shoulder, mid-back and right ankles. She reports taking OTC Tylenol for relief of symptoms. She is also noted to have hx of generalized pain, fibromyalgia and cervical spondylosis. 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 She notes recent onset of pain and swelling and the affected site with no drainage noted.   Past Medical History:  Diagnosis Date   Colon cancer [facility])     Past Surgical History:  Procedure Laterality Date   COLON SURGERY      History reviewed. No pertinent family history.  Social History   Socioeconomic History   Marital status: Single    Spouse name: Not on file   Number of children: Not on file   Years of education: Not on file   Highest education level: Not on file  Occupational History   Not on file  Tobacco Use   Smoking status: Former   Smokeless tobacco: Never  Substance and Sexual Activity   Alcohol use: Not Currently   Drug use: Never   Sexual activity: Not on file  Other Topics Concern   Not on file  Social History Narrative   Not on file   Social Determinants of Health   Financial Resource Strain: Not on file  Food Insecurity: Not on file  Transportation Needs: Not on file  Physical Activity: Not on file  Stress: Not on file  Social Connections: Not on file  Intimate Partner Violence: Not on file    ROS Review of Systems  Constitutional:  Positive for  fatigue. Negative for chills and fever.  HENT:  Negative for sneezing, sore throat and trouble swallowing.   Respiratory:  Negative for shortness of breath.   Cardiovascular:  Negative for chest pain and palpitations.  Gastrointestinal:  Negative for constipation, diarrhea, nausea and vomiting.  Endocrine: Negative for polydipsia, polyphagia and polyuria.  Genitourinary:  Negative for frequency and urgency.  Musculoskeletal:  Positive for arthralgias and joint swelling (right knee).  Skin:  Negative for rash and wound.       Lesion at the nape of the neck on the left side  Neurological:  Negative for seizures, light-headedness and numbness.  Psychiatric/Behavioral:  Negative for confusion.    Objective:   Today's Vitals: BP (!) 147/74   Pulse 73   Ht 5' 4" (1.626 m)   Wt 198 lb 9.6 oz (90.1 kg)   SpO2 95%   BMI 34.09 kg/m   Physical Exam HENT:     Head: Normocephalic.     Right Ear: External ear normal.     Left Ear: External ear normal.     Nose: No congestion or rhinorrhea.     Mouth/Throat:     Mouth: Mucous membranes are moist.     Dentition: Has dentures.  Eyes:     Pupils: Pupils are equal, round, and reactive to light.  Cardiovascular:     Rate and Rhythm:  Normal rate and regular rhythm.     Pulses: Normal pulses.     Heart sounds: Normal heart sounds.  Pulmonary:     Effort: Pulmonary effort is normal.     Breath sounds: Normal breath sounds.  Abdominal:     Palpations: Abdomen is soft.     Tenderness: There is no abdominal tenderness.  Musculoskeletal:        General: No swelling.     Cervical back: No rigidity.     Right ankle: Swelling present. Tenderness present.     Left ankle: Swelling present. Tenderness present.     Right foot: Swelling present.     Left foot: Swelling present.  Skin:    General: Skin is warm.     Capillary Refill: Capillary refill takes less than 2 seconds.     Findings: Lesion (3 cm closed carbuncle at the nape of the neck (left  side) swollen and painful with palpation) present.  Neurological:     Mental Status: She is alert and oriented to person, place, and time.  Psychiatric:     Comments: Normal affect    Assessment & Plan:   Problem List Items Addressed This Visit       Musculoskeletal and Integument   Osteoarthritis    -pt is not a candidate for oral NSAID treatments -will order topical NSAID instead and encourage the use of Tylenol       Carbuncle of neck    - assessment findings consistent with carbuncle -will send topical antibiotic ointment to apply when lesions rupture       Relevant Medications   mupirocin ointment (BACTROBAN) 2 %     Other   Vitamin D deficiency   Relevant Orders   Vitamin D (25 hydroxy)   Bilateral swelling of feet and ankles    -patient is complaint with diuretics treatments   -Recommend conservative managements to help decrease swelling in your  Feet and ankles.   -Reduce salt (sodium) in your diet -- Sodium, which is found in table salt and processed foods, can worsen edema. Reducing the amount of salt you consume can help to reduce edema, especially if you also take a diuretic.  -Compression stockings -- Leg edema can be prevented and treated with the use of compression stockings. Stockings are available in several heights, including knee-high, thigh-high, and pantyhose. Knee-high stockings are sufficient for most patients. - Body positioning -- Leg, ankle, and foot edema can be improved by elevating the legs above heart level for 30 minutes three or four times per day. Elevating the legs may be sufficient to reduce or eliminate edema      Other Visit Diagnoses     Carbuncle, neck    -  Primary   Relevant Medications   mupirocin ointment (BACTROBAN) 2 %   Osteoporosis screening       Relevant Orders   HM DEXA SCAN (Completed)   IFG (impaired fasting glucose)       Relevant Orders   Hemoglobin A1C   Encounter for general adult medical examination with  abnormal findings       Relevant Orders   CBC with Differential/Platelet   CMP14+EGFR   TSH + free T4   Lipid panel   Arthritis       Relevant Medications   diclofenac Sodium (VOLTAREN) 1 % GEL       Outpatient Encounter Medications as of 11/07/2021  Medication Sig   diclofenac Sodium (VOLTAREN) 1 % GEL Apply 4 g  topically 4 (four) times daily.   furosemide (LASIX) 40 MG tablet Take 20 mg by mouth daily.    levothyroxine (SYNTHROID) 100 MCG tablet Take 100 mcg by mouth daily before breakfast.    lisinopril-hydrochlorothiazide (ZESTORETIC) 20-25 MG tablet Take 1 tablet by mouth daily.    mupirocin ointment (BACTROBAN) 2 % Apply 1 application. topically 2 (two) times daily.   potassium chloride SA (K-DUR) 20 MEQ tablet Take 40 mEq by mouth daily.    simvastatin (ZOCOR) 40 MG tablet Take 40 mg by mouth daily at 6 PM.    allopurinol (ZYLOPRIM) 100 MG tablet Take 100 mg by mouth daily.  (Patient not taking: Reported on 11/07/2021)   diazepam (VALIUM) 2 MG tablet Take 1 tablet (2 mg total) by mouth every 8 (eight) hours as needed (dizziness). (Patient not taking: Reported on 11/07/2021)   enoxaparin (LOVENOX) 40 MG/0.4ML injection Inject 40 mg into the skin. Every 21 days (Patient not taking: Reported on 11/07/2021)   meclizine (ANTIVERT) 25 MG tablet Take 1 tablet (25 mg total) by mouth 3 (three) times daily as needed for dizziness. (Patient not taking: Reported on 11/07/2021)   polyethylene glycol (MIRALAX / GLYCOLAX) 17 g packet Take 17 g by mouth daily.  (Patient not taking: Reported on 11/07/2021)   senna-docusate (SENOKOT-S) 8.6-50 MG tablet Take 1 tablet by mouth daily.  (Patient not taking: Reported on 11/07/2021)   No facility-administered encounter medications on file as of 11/07/2021.    Follow-up: Return in about 3 months (around 02/07/2022).   Alvira Monday, FNP

## 2021-11-08 ENCOUNTER — Telehealth: Payer: Self-pay | Admitting: Family Medicine

## 2021-11-08 LAB — HEMOGLOBIN A1C
Est. average glucose Bld gHb Est-mCnc: 108 mg/dL
Hgb A1c MFr Bld: 5.4 % (ref 4.8–5.6)

## 2021-11-08 LAB — CMP14+EGFR
ALT: 10 IU/L (ref 0–32)
AST: 14 IU/L (ref 0–40)
Albumin/Globulin Ratio: 1.7 (ref 1.2–2.2)
Albumin: 4.5 g/dL (ref 3.6–4.6)
Alkaline Phosphatase: 60 IU/L (ref 44–121)
BUN/Creatinine Ratio: 18 (ref 12–28)
BUN: 18 mg/dL (ref 8–27)
Bilirubin Total: 0.4 mg/dL (ref 0.0–1.2)
CO2: 24 mmol/L (ref 20–29)
Calcium: 9.9 mg/dL (ref 8.7–10.3)
Chloride: 104 mmol/L (ref 96–106)
Creatinine, Ser: 0.99 mg/dL (ref 0.57–1.00)
Globulin, Total: 2.6 g/dL (ref 1.5–4.5)
Glucose: 88 mg/dL (ref 70–99)
Potassium: 3.6 mmol/L (ref 3.5–5.2)
Sodium: 144 mmol/L (ref 134–144)
Total Protein: 7.1 g/dL (ref 6.0–8.5)
eGFR: 57 mL/min/{1.73_m2} — ABNORMAL LOW (ref >59)

## 2021-11-08 LAB — TSH+FREE T4
Free T4: 1.16 ng/dL (ref 0.82–1.77)
TSH: 1.08 u[IU]/mL (ref 0.450–4.500)

## 2021-11-08 LAB — LIPID PANEL
Chol/HDL Ratio: 4.5 ratio — ABNORMAL HIGH (ref 0.0–4.4)
Cholesterol, Total: 246 mg/dL — ABNORMAL HIGH (ref 100–199)
HDL: 55 mg/dL (ref >39)
LDL Chol Calc (NIH): 166 mg/dL — ABNORMAL HIGH (ref 0–99)
Triglycerides: 139 mg/dL (ref 0–149)
VLDL Cholesterol Cal: 25 mg/dL (ref 5–40)

## 2021-11-08 LAB — CBC WITH DIFFERENTIAL/PLATELET
Basophils Absolute: 0 10*3/uL (ref 0.0–0.2)
Basos: 1 %
EOS (ABSOLUTE): 0 10*3/uL (ref 0.0–0.4)
Eos: 1 %
Hematocrit: 40.3 % (ref 34.0–46.6)
Hemoglobin: 13.9 g/dL (ref 11.1–15.9)
Immature Grans (Abs): 0 10*3/uL (ref 0.0–0.1)
Immature Granulocytes: 0 %
Lymphocytes Absolute: 1.6 10*3/uL (ref 0.7–3.1)
Lymphs: 45 %
MCH: 33.6 pg — ABNORMAL HIGH (ref 26.6–33.0)
MCHC: 34.5 g/dL (ref 31.5–35.7)
MCV: 97 fL (ref 79–97)
Monocytes Absolute: 0.4 10*3/uL (ref 0.1–0.9)
Monocytes: 13 %
Neutrophils Absolute: 1.4 10*3/uL (ref 1.4–7.0)
Neutrophils: 40 %
Platelets: 232 10*3/uL (ref 150–450)
RBC: 4.14 x10E6/uL (ref 3.77–5.28)
RDW: 12.8 % (ref 11.7–15.4)
WBC: 3.5 10*3/uL (ref 3.4–10.8)

## 2021-11-08 LAB — VITAMIN D 25 HYDROXY (VIT D DEFICIENCY, FRACTURES): Vit D, 25-Hydroxy: 41.9 ng/mL (ref 30.0–100.0)

## 2021-11-08 NOTE — Telephone Encounter (Signed)
Pt advised with verbal understanding  °

## 2021-11-09 ENCOUNTER — Other Ambulatory Visit: Payer: Self-pay | Admitting: Family Medicine

## 2021-11-09 NOTE — Progress Notes (Unsigned)
The ASCVD Risk score (Arnett DK, et al., 2019) failed to calculate for the following reasons:    The 2019 ASCVD risk score is only valid for ages 40 to 79

## 2021-11-09 NOTE — Progress Notes (Signed)
Please inform the patient that her cholesterol is elevated. It is recommended to start treatments with Statins to prevent cardiovascular disease. Please ask the patient is would be willing to start therapy. All other labs are normal.

## 2021-11-12 ENCOUNTER — Other Ambulatory Visit: Payer: Self-pay

## 2021-11-12 ENCOUNTER — Other Ambulatory Visit: Payer: Self-pay | Admitting: Family Medicine

## 2021-11-12 DIAGNOSIS — E785 Hyperlipidemia, unspecified: Secondary | ICD-10-CM

## 2021-11-12 MED ORDER — SIMVASTATIN 40 MG PO TABS: 40.0000 mg | ORAL_TABLET | Freq: Every day | ORAL | 2 refills | Status: DC

## 2021-11-12 NOTE — Progress Notes (Unsigned)
The ASCVD Risk score (Arnett DK, et al., 2019) failed to calculate for the following reasons:    The 2019 ASCVD risk score is only valid for ages 40 to 79

## 2021-11-12 NOTE — Progress Notes (Signed)
Inform the patient that I've refilled her simvastatin 40 mg, and the prescription is sent to her pharmacy

## 2021-11-26 ENCOUNTER — Ambulatory Visit (INDEPENDENT_AMBULATORY_CARE_PROVIDER_SITE_OTHER): Payer: Medicare HMO

## 2021-11-26 DIAGNOSIS — Z0000 Encounter for general adult medical examination without abnormal findings: Secondary | ICD-10-CM

## 2021-11-26 NOTE — Progress Notes (Signed)
Subjective:   Deborah Lane is a 81 y.o. female who presents for an Initial Medicare Annual Wellness Visit. I connected with  Margorie John on 11/26/21 by a audio enabled telemedicine application and verified that I am speaking with the correct person using two identifiers.  Patient Location: Home  Provider Location: Office/Clinic  I discussed the limitations of evaluation and management by telemedicine. The patient expressed understanding and agreed to proceed.  Review of Systems           Objective:    There were no vitals filed for this visit. There is no height or weight on file to calculate BMI.     09/21/2020    3:57 PM  Advanced Directives  Does Patient Have a Medical Advance Directive? No  Would patient like information on creating a medical advance directive? No - Patient declined    Current Medications (verified) Outpatient Encounter Medications as of 11/26/2021  Medication Sig   allopurinol (ZYLOPRIM) 100 MG tablet Take 100 mg by mouth daily.  (Patient not taking: Reported on 11/07/2021)   diazepam (VALIUM) 2 MG tablet Take 1 tablet (2 mg total) by mouth every 8 (eight) hours as needed (dizziness). (Patient not taking: Reported on 11/07/2021)   diclofenac Sodium (VOLTAREN) 1 % GEL Apply 4 g topically 4 (four) times daily.   enoxaparin (LOVENOX) 40 MG/0.4ML injection Inject 40 mg into the skin. Every 21 days (Patient not taking: Reported on 11/07/2021)   furosemide (LASIX) 40 MG tablet Take 20 mg by mouth daily.    levothyroxine (SYNTHROID) 100 MCG tablet Take 100 mcg by mouth daily before breakfast.    lisinopril-hydrochlorothiazide (ZESTORETIC) 20-25 MG tablet Take 1 tablet by mouth daily.    meclizine (ANTIVERT) 25 MG tablet Take 1 tablet (25 mg total) by mouth 3 (three) times daily as needed for dizziness. (Patient not taking: Reported on 11/07/2021)   mupirocin ointment (BACTROBAN) 2 % Apply 1 application. topically 2 (two) times daily.   polyethylene glycol (MIRALAX /  GLYCOLAX) 17 g packet Take 17 g by mouth daily.  (Patient not taking: Reported on 11/07/2021)   potassium chloride SA (K-DUR) 20 MEQ tablet Take 40 mEq by mouth daily.    senna-docusate (SENOKOT-S) 8.6-50 MG tablet Take 1 tablet by mouth daily.  (Patient not taking: Reported on 11/07/2021)   simvastatin (ZOCOR) 40 MG tablet Take 1 tablet (40 mg total) by mouth daily at 6 PM.   No facility-administered encounter medications on file as of 11/26/2021.    Allergies (verified) Pregabalin   History: Past Medical History:  Diagnosis Date   Colon cancer [facility])    Past Surgical History:  Procedure Laterality Date   COLON SURGERY     No family history on file. Social History   Socioeconomic History   Marital status: Single    Spouse name: Not on file   Number of children: Not on file   Years of education: Not on file   Highest education level: Not on file  Occupational History   Not on file  Tobacco Use   Smoking status: Former   Smokeless tobacco: Never  Substance and Sexual Activity   Alcohol use: Not Currently   Drug use: Never   Sexual activity: Not on file  Other Topics Concern   Not on file  Social History Narrative   Not on file   Social Determinants of Health   Financial Resource Strain: Not on file  Food Insecurity: Not on file  Transportation Needs: Not on  file  Physical Activity: Not on file  Stress: Not on file  Social Connections: Not on file    Tobacco Counseling Counseling given: Not Answered   Clinical Intake:                 Diabetic?no         Activities of Daily Living     No data to display          Patient Care Team: Gilmore Laroche, FNP as PCP - General (Family Medicine)  Indicate any recent Medical Services you may have received from other than [facility] providers in the past year (date may be approximate).     Assessment:   This is a routine wellness examination for [facility].  Hearing/Vision screen No results  found.  Dietary issues and exercise activities discussed:     Goals Addressed   None    Depression Screen    11/07/2021   10:28 AM  PHQ 2/9 Scores  PHQ - 2 Score 1    Fall Risk    11/07/2021   10:28 AM  Fall Risk   Falls in the past year? 0  Number falls in past yr: 0  Injury with Fall? 0  Risk for fall due to : No Fall Risks  Follow up Falls evaluation completed    FALL RISK PREVENTION PERTAINING TO THE HOME:  Any stairs in or around the home? No  If so, are there any without handrails? No  Home free of loose throw rugs in walkways, pet beds, electrical cords, etc? Yes  Adequate lighting in your home to reduce risk of falls? Yes   ASSISTIVE DEVICES UTILIZED TO PREVENT FALLS:  Life alert? Yes  Use of a cane, walker or w/c? Yes  Grab bars in the bathroom? Yes  Shower chair or bench in shower? Yes  Elevated toilet seat or a handicapped toilet? Yes   TIMED UP AND GO:  Was the test performed? No .  Length of time to ambulate 10 feet:  sec.    Cognitive Function:        Immunizations  There is no immunization history on file for this patient.  TDAP status: Due, Education has been provided regarding the importance of this vaccine. Advised may receive this vaccine at local pharmacy or Health Dept. Aware to provide a copy of the vaccination record if obtained from local pharmacy or Health Dept. Verbalized acceptance and understanding.  Flu Vaccine status: Up to date  Pneumococcal vaccine status: Due, Education has been provided regarding the importance of this vaccine. Advised may receive this vaccine at local pharmacy or Health Dept. Aware to provide a copy of the vaccination record if obtained from local pharmacy or Health Dept. Verbalized acceptance and understanding.  Covid-19 vaccine status: Information provided on how to obtain vaccines.   Qualifies for Shingles Vaccine? Yes   Zostavax completed No   Shingrix Completed?: No.    Education has been provided  regarding the importance of this vaccine. Patient has been advised to call insurance company to determine out of pocket expense if they have not yet received this vaccine. Advised may also receive vaccine at local pharmacy or Health Dept. Verbalized acceptance and understanding.  Screening Tests Health Maintenance  Topic Date Due   COVID-19 Vaccine (1) Never done   TETANUS/TDAP  Never done   Zoster Vaccines- Shingrix (1 of 2) Never done   Pneumonia Vaccine 82+ Years old (1 - PCV) Never done   INFLUENZA VACCINE  01/01/2022   DEXA SCAN  Completed   HPV VACCINES  Aged Out    Health Maintenance  Health Maintenance Due  Topic Date Due   COVID-19 Vaccine (1) Never done   TETANUS/TDAP  Never done   Zoster Vaccines- Shingrix (1 of 2) Never done   Pneumonia Vaccine 24+ Years old (1 - PCV) Never done    Colorectal cancer screening: No longer required.   Mammogram status: No longer required due to age.  Bone Density status: Ordered 11/07/21. Pt provided with contact info and advised to call to schedule appt.  Lung Cancer Screening: (Low Dose CT Chest recommended if Age 53-80 years, 30 pack-year currently smoking OR have quit w/in 15years.) does not qualify.   Lung Cancer Screening Referral:   Additional Screening:  Hepatitis C Screening: does not qualify; Completed   Vision Screening: Recommended annual ophthalmology exams for early detection of glaucoma and other disorders of the eye. Is the patient up to date with their annual eye exam?  No  Who is the provider or what is the name of the office in which the patient attends annual eye exams?  If pt is not established with a provider, would they like to be referred to a provider to establish care? No .   Dental Screening: Recommended annual dental exams for proper oral hygiene  Community Resource Referral / Chronic Care Management: CRR required this visit?  No   CCM required this visit?  No      Plan:     I have personally  reviewed and noted the following in the patient's chart:   Medical and social history Use of alcohol, tobacco or illicit drugs  Current medications and supplements including opioid prescriptions. Patient is not currently taking opioid prescriptions. Functional ability and status Nutritional status Physical activity Advanced directives List of other physicians Hospitalizations, surgeries, and ER visits in previous 12 months Vitals Screenings to include cognitive, depression, and falls Referrals and appointments  In addition, I have reviewed and discussed with patient certain preventive protocols, quality metrics, and best practice recommendations. A written personalized care plan for preventive services as well as general preventive health recommendations were provided to patient.     Harriet Pho, CMA   11/26/2021   Nurse Notes:

## 2021-12-24 ENCOUNTER — Other Ambulatory Visit: Payer: Self-pay | Admitting: Family Medicine

## 2022-02-06 ENCOUNTER — Ambulatory Visit: Payer: Medicare HMO | Admitting: Family Medicine

## 2022-02-12 ENCOUNTER — Ambulatory Visit (INDEPENDENT_AMBULATORY_CARE_PROVIDER_SITE_OTHER): Payer: Medicare HMO | Admitting: Family Medicine

## 2022-02-12 ENCOUNTER — Encounter: Payer: Self-pay | Admitting: Family Medicine

## 2022-02-12 VITALS — BP 151/86 | HR 78 | Ht 64.0 in | Wt 198.1 lb

## 2022-02-12 DIAGNOSIS — E039 Hypothyroidism, unspecified: Secondary | ICD-10-CM | POA: Diagnosis not present

## 2022-02-12 DIAGNOSIS — E876 Hypokalemia: Secondary | ICD-10-CM

## 2022-02-12 DIAGNOSIS — M10079 Idiopathic gout, unspecified ankle and foot: Secondary | ICD-10-CM

## 2022-02-12 DIAGNOSIS — Z23 Encounter for immunization: Secondary | ICD-10-CM

## 2022-02-12 DIAGNOSIS — E785 Hyperlipidemia, unspecified: Secondary | ICD-10-CM | POA: Diagnosis not present

## 2022-02-12 DIAGNOSIS — I10 Essential (primary) hypertension: Secondary | ICD-10-CM | POA: Diagnosis not present

## 2022-02-12 MED ORDER — LISINOPRIL-HYDROCHLOROTHIAZIDE 20-25 MG PO TABS: 1.0000 | ORAL_TABLET | Freq: Every day | ORAL | 1 refills | Status: DC

## 2022-02-12 MED ORDER — LEVOTHYROXINE SODIUM 100 MCG PO TABS: 100.0000 ug | ORAL_TABLET | Freq: Every day | ORAL | 2 refills | Status: DC

## 2022-02-12 MED ORDER — POTASSIUM CHLORIDE CRYS ER 20 MEQ PO TBCR: 40.0000 meq | EXTENDED_RELEASE_TABLET | Freq: Every day | ORAL | 2 refills | Status: DC

## 2022-02-12 MED ORDER — FUROSEMIDE 40 MG PO TABS: 20.0000 mg | ORAL_TABLET | Freq: Every day | ORAL | 2 refills | Status: DC

## 2022-02-12 MED ORDER — ALLOPURINOL 100 MG PO TABS: 100.0000 mg | ORAL_TABLET | Freq: Every day | ORAL | 2 refills | Status: DC

## 2022-02-12 MED ORDER — SIMVASTATIN 40 MG PO TABS: 40.0000 mg | ORAL_TABLET | Freq: Every day | ORAL | 2 refills | Status: DC

## 2022-02-12 NOTE — Patient Instructions (Signed)
I appreciate the opportunity to provide care to you today!    Follow up:  3 months CPE  Please pick up your medications at the pharmacy   Please continue to a heart-healthy diet and increase your physical activities. Try to exercise for 14mns at least three times a week.      It was a pleasure to see you and I look forward to continuing to work together on your health and well-being. Please do not hesitate to call the office if you need care or have questions about your care.   Have a wonderful day and week. With Gratitude, GAlvira MondayMSN, FNP-BC

## 2022-02-12 NOTE — Progress Notes (Unsigned)
Established Patient Office Visit  Subjective:  Patient ID: Deborah Lane, female    DOB: August 26, 1940  Age: 81 y.o. MRN: [id number]  CC:  Chief Complaint  Patient presents with   Follow-up    Pt following up.     HPI [name] is a 81 y.o. female with past medical history of HTN, sleep apnea, hypothyroidism presents for f/u of  chronic medical conditions. FKC:[id number]. Reports that she has been out of her blood pressure medications for a few days with notable swelling in her lower extremities. She denies headaches, dizziness, and blurred vision.   Past Medical History:  Diagnosis Date   Colon cancer [facility])     Past Surgical History:  Procedure Laterality Date   COLON SURGERY      History reviewed. No pertinent family history.  Social History   Socioeconomic History   Marital status: Single    Spouse name: Not on file   Number of children: Not on file   Years of education: Not on file   Highest education level: Not on file  Occupational History   Not on file  Tobacco Use   Smoking status: Former   Smokeless tobacco: Never  Substance and Sexual Activity   Alcohol use: Not Currently   Drug use: Never   Sexual activity: Not on file  Other Topics Concern   Not on file  Social History Narrative   Not on file   Social Determinants of Health   Financial Resource Strain: Not on file  Food Insecurity: Not on file  Transportation Needs: Not on file  Physical Activity: Not on file  Stress: Not on file  Social Connections: Not on file  Intimate Partner Violence: Not on file    Outpatient Medications Prior to Visit  Medication Sig Dispense Refill   diazepam (VALIUM) 2 MG tablet Take 1 tablet (2 mg total) by mouth every 8 (eight) hours as needed (dizziness). 10 tablet 0   diclofenac Sodium (VOLTAREN) 1 % GEL Apply 4 g topically 4 (four) times daily. 4 g 1   meclizine (ANTIVERT) 25 MG tablet Take 1 tablet (25 mg total) by mouth 3 (three) times daily as needed for  dizziness. 30 tablet 0   mupirocin ointment (BACTROBAN) 2 % Apply 1 application. topically 2 (two) times daily. 22 g 0   polyethylene glycol (MIRALAX / GLYCOLAX) 17 g packet Take 17 g by mouth daily.     senna-docusate (SENOKOT-S) 8.6-50 MG tablet Take 1 tablet by mouth daily.     allopurinol (ZYLOPRIM) 100 MG tablet Take 100 mg by mouth daily.     furosemide (LASIX) 40 MG tablet Take 20 mg by mouth daily.     levothyroxine (SYNTHROID) 100 MCG tablet Take 100 mcg by mouth daily before breakfast.      lisinopril-hydrochlorothiazide (ZESTORETIC) 20-25 MG tablet Take 1 tablet by mouth daily.      potassium chloride SA (K-DUR) 20 MEQ tablet Take 40 mEq by mouth daily.      simvastatin (ZOCOR) 40 MG tablet Take 1 tablet (40 mg total) by mouth daily at 6 PM. 60 tablet 2   enoxaparin (LOVENOX) 40 MG/0.4ML injection Inject 40 mg into the skin. Every 21 days (Patient not taking: Reported on 11/07/2021)     No facility-administered medications prior to visit.    Allergies  Allergen Reactions   Pregabalin Nausea Only    ROS Review of Systems  Constitutional:  Negative for chills and fever.  Respiratory:  Negative for chest  tightness and shortness of breath.   Genitourinary:  Negative for flank pain and menstrual problem.  Skin:  Negative for color change and pallor.  Neurological:  Negative for dizziness, numbness and headaches.      Objective:    Physical Exam Cardiovascular:     Rate and Rhythm: Normal rate and regular rhythm.  Pulmonary:     Effort: Pulmonary effort is normal.     Breath sounds: Normal breath sounds.  Abdominal:     Palpations: Abdomen is soft.  Musculoskeletal:     Right lower leg: Edema present.     Left lower leg: Edema present.  Neurological:     Mental Status: She is alert.     BP (!) 151/86   Pulse 78   Ht '5\' 4"'  (1.626 m)   Wt 198 lb 1.3 oz (89.8 kg)   SpO2 97%   BMI 34.00 kg/m  Wt Readings from Last 3 Encounters:  02/12/22 198 lb 1.3 oz (89.8 kg)   11/07/21 198 lb 9.6 oz (90.1 kg)  09/21/20 210 lb (95.3 kg)    Lab Results  Component Value Date   TSH 1.080 11/07/2021   Lab Results  Component Value Date   WBC 3.5 11/07/2021   HGB 13.9 11/07/2021   HCT 40.3 11/07/2021   MCV 97 11/07/2021   PLT 232 11/07/2021   Lab Results  Component Value Date   NA 144 11/07/2021   K 3.6 11/07/2021   CO2 24 11/07/2021   GLUCOSE 88 11/07/2021   BUN 18 11/07/2021   CREATININE 0.99 11/07/2021   BILITOT 0.4 11/07/2021   ALKPHOS 60 11/07/2021   AST 14 11/07/2021   ALT 10 11/07/2021   PROT 7.1 11/07/2021   ALBUMIN 4.5 11/07/2021   CALCIUM 9.9 11/07/2021   ANIONGAP 9 09/21/2020   EGFR 57 (L) 11/07/2021   Lab Results  Component Value Date   CHOL 246 (H) 11/07/2021   Lab Results  Component Value Date   HDL 55 11/07/2021   Lab Results  Component Value Date   LDLCALC 166 (H) 11/07/2021   Lab Results  Component Value Date   TRIG 139 11/07/2021   Lab Results  Component Value Date   CHOLHDL 4.5 (H) 11/07/2021   Lab Results  Component Value Date   HGBA1C 5.4 11/07/2021      Assessment & Plan:   Problem List Items Addressed This Visit       Cardiovascular and Mediastinum   Hypertension - Primary    Refilled furosemide 40 mg, Zestoretic 20-25 mg and potassium chloride 20 MEQ Encouraged to continue treatment regimen      Relevant Medications   lisinopril-hydrochlorothiazide (ZESTORETIC) 20-25 MG tablet   simvastatin (ZOCOR) 40 MG tablet   furosemide (LASIX) 40 MG tablet     Endocrine   Hypothyroidism (acquired)    Refilled synthroid      Relevant Medications   levothyroxine (SYNTHROID) 100 MCG tablet     Other   Gout, unspecified    Refilled allopurinol      Relevant Medications   allopurinol (ZYLOPRIM) 100 MG tablet   Need for immunization against influenza    Patient educated on CDC recommendation for the vaccine. Verbal consent was obtained from the patient, vaccine administered by nurse, no sign of  adverse reactions noted at this time. Patient education on arm soreness and use of tylenol or ibuprofen for this patient  was discussed. Patient educated on the signs and symptoms of adverse effect and advise to  contact the office if they occur.      Other Visit Diagnoses     Immunization due       Relevant Orders   Pneumococcal conjugate vaccine 20-valent (Completed)   Flu Vaccine QUAD High Dose(Fluad) (Completed)   Dyslipidemia, goal LDL below 100       Relevant Medications   lisinopril-hydrochlorothiazide (ZESTORETIC) 20-25 MG tablet   simvastatin (ZOCOR) 40 MG tablet   furosemide (LASIX) 40 MG tablet   Potassium deficiency       Relevant Medications   potassium chloride SA (KLOR-CON M) 20 MEQ tablet       Meds ordered this encounter  Medications   lisinopril-hydrochlorothiazide (ZESTORETIC) 20-25 MG tablet    Sig: Take 1 tablet by mouth daily.    Dispense:  90 tablet    Refill:  1   allopurinol (ZYLOPRIM) 100 MG tablet    Sig: Take 1 tablet (100 mg total) by mouth daily.    Dispense:  30 tablet    Refill:  2   levothyroxine (SYNTHROID) 100 MCG tablet    Sig: Take 1 tablet (100 mcg total) by mouth daily before breakfast.    Dispense:  30 tablet    Refill:  2   potassium chloride SA (KLOR-CON M) 20 MEQ tablet    Sig: Take 2 tablets (40 mEq total) by mouth daily.    Dispense:  90 tablet    Refill:  2   simvastatin (ZOCOR) 40 MG tablet    Sig: Take 1 tablet (40 mg total) by mouth daily at 6 PM.    Dispense:  60 tablet    Refill:  2   furosemide (LASIX) 40 MG tablet    Sig: Take 0.5 tablets (20 mg total) by mouth daily.    Dispense:  30 tablet    Refill:  2    Follow-up: Return in about 3 months (around 05/14/2022).    Alvira Monday, FNP

## 2022-02-13 DIAGNOSIS — Z23 Encounter for immunization: Secondary | ICD-10-CM | POA: Insufficient documentation

## 2022-02-13 NOTE — Assessment & Plan Note (Signed)
Refilled allopurinol 

## 2022-02-13 NOTE — Assessment & Plan Note (Signed)
Refilled synthroid

## 2022-02-13 NOTE — Assessment & Plan Note (Signed)
Refilled furosemide 40 mg, Zestoretic 20-25 mg and potassium chloride 20 MEQ Encouraged to continue treatment regimen

## 2022-02-13 NOTE — Assessment & Plan Note (Signed)
Patient educated on CDC recommendation for the vaccine. Verbal consent was obtained from the patient, vaccine administered by nurse, no sign of adverse reactions noted at this time. Patient education on arm soreness and use of tylenol or ibuprofen for this patient  was discussed. Patient educated on the signs and symptoms of adverse effect and advise to contact the office if they occur.  

## 2022-04-15 ENCOUNTER — Telehealth: Payer: Self-pay | Admitting: Family Medicine

## 2022-04-15 NOTE — Telephone Encounter (Signed)
PCS forms    Copied Noted  Sleeved   Forms in brown folder.

## 2022-05-14 ENCOUNTER — Ambulatory Visit (INDEPENDENT_AMBULATORY_CARE_PROVIDER_SITE_OTHER): Payer: Medicare HMO | Admitting: Family Medicine

## 2022-05-14 ENCOUNTER — Encounter: Payer: Self-pay | Admitting: Family Medicine

## 2022-05-14 VITALS — BP 128/82 | HR 80 | Ht 67.0 in | Wt 197.0 lb

## 2022-05-14 DIAGNOSIS — E782 Mixed hyperlipidemia: Secondary | ICD-10-CM

## 2022-05-14 DIAGNOSIS — M159 Polyosteoarthritis, unspecified: Secondary | ICD-10-CM

## 2022-05-14 DIAGNOSIS — E559 Vitamin D deficiency, unspecified: Secondary | ICD-10-CM

## 2022-05-14 DIAGNOSIS — R7301 Impaired fasting glucose: Secondary | ICD-10-CM | POA: Diagnosis not present

## 2022-05-14 DIAGNOSIS — M1990 Unspecified osteoarthritis, unspecified site: Secondary | ICD-10-CM | POA: Diagnosis not present

## 2022-05-14 DIAGNOSIS — E039 Hypothyroidism, unspecified: Secondary | ICD-10-CM

## 2022-05-14 DIAGNOSIS — L0213 Carbuncle of neck: Secondary | ICD-10-CM

## 2022-05-14 DIAGNOSIS — M150 Primary generalized (osteo)arthritis: Secondary | ICD-10-CM

## 2022-05-14 DIAGNOSIS — I10 Essential (primary) hypertension: Secondary | ICD-10-CM

## 2022-05-14 MED ORDER — MUPIROCIN 2 % EX OINT: 1.0000 | TOPICAL_OINTMENT | Freq: Two times a day (BID) | CUTANEOUS | 0 refills | Status: AC

## 2022-05-14 MED ORDER — METHYLPREDNISOLONE ACETATE 40 MG/ML IJ SUSP
40.0000 mg | Freq: Once | INTRAMUSCULAR | Status: AC
  Administered 2022-05-14: 40 mg via INTRAMUSCULAR

## 2022-05-14 MED ORDER — DICLOFENAC SODIUM 1 % EX GEL: 4.0000 g | Freq: Four times a day (QID) | CUTANEOUS | 1 refills | Status: AC

## 2022-05-14 MED ORDER — ACETAMINOPHEN 500 MG PO TABS: 500.0000 mg | ORAL_TABLET | Freq: Four times a day (QID) | ORAL | 0 refills | Status: AC | PRN

## 2022-05-14 NOTE — Progress Notes (Signed)
Established Patient Office Visit  Subjective:  Patient ID: Deborah Lane, female    DOB: [birth year]  Age: 81 y.o. MRN: [id number]  CC:  Chief Complaint  Patient presents with   Annual Exam    Cpe today.   Leg Pain    Pt reports left leg pain, used to get shots in her knee and this seemed to help her, she would like to discuss this.     HPI Deborah Lane is a 81 y.o. female with past medical history of benign essential hypertension, sleep apnea, and stage III chronic kidney disease presents for f/u of  chronic medical conditions.  Osteoarthritis of the knees: She complains of pain in her left knee and hips.  Pain is rated 10 out of 10 and constant.  She reports that pain intensifies with ambulation and prolonged standing.  She reports receiving steroid injections in her left knee 2 years ago from her doctor in [place], which relieved her symptoms.  She has not had a steroid injection since.  Past Medical History:  Diagnosis Date   Colon cancer [facility])     Past Surgical History:  Procedure Laterality Date   COLON SURGERY      History reviewed. No pertinent family history.  Social History   Socioeconomic History   Marital status: Single    Spouse name: Not on file   Number of children: Not on file   Years of education: Not on file   Highest education level: Not on file  Occupational History   Not on file  Tobacco Use   Smoking status: Former   Smokeless tobacco: Never  Substance and Sexual Activity   Alcohol use: Not Currently   Drug use: Never   Sexual activity: Not on file  Other Topics Concern   Not on file  Social History Narrative   Not on file   Social Determinants of Health   Financial Resource Strain: Not on file  Food Insecurity: Not on file  Transportation Needs: Not on file  Physical Activity: Not on file  Stress: Not on file  Social Connections: Not on file  Intimate Partner Violence: Not on file    Outpatient Medications Prior to Visit   Medication Sig Dispense Refill   allopurinol (ZYLOPRIM) 100 MG tablet Take 1 tablet (100 mg total) by mouth daily. 30 tablet 2   diazepam (VALIUM) 2 MG tablet Take 1 tablet (2 mg total) by mouth every 8 (eight) hours as needed (dizziness). 10 tablet 0   furosemide (LASIX) 40 MG tablet Take 0.5 tablets (20 mg total) by mouth daily. 30 tablet 2   levothyroxine (SYNTHROID) 100 MCG tablet Take 1 tablet (100 mcg total) by mouth daily before breakfast. 30 tablet 2   lisinopril-hydrochlorothiazide (ZESTORETIC) 20-25 MG tablet Take 1 tablet by mouth daily. 90 tablet 1   polyethylene glycol (MIRALAX / GLYCOLAX) 17 g packet Take 17 g by mouth daily.     potassium chloride SA (KLOR-CON M) 20 MEQ tablet Take 2 tablets (40 mEq total) by mouth daily. 90 tablet 2   senna-docusate (SENOKOT-S) 8.6-50 MG tablet Take 1 tablet by mouth daily.     simvastatin (ZOCOR) 40 MG tablet Take 1 tablet (40 mg total) by mouth daily at 6 PM. 60 tablet 2   meclizine (ANTIVERT) 25 MG tablet Take 1 tablet (25 mg total) by mouth 3 (three) times daily as needed for dizziness. 30 tablet 0   diclofenac Sodium (VOLTAREN) 1 % GEL Apply 4 g topically 4 (  four) times daily. (Patient not taking: Reported on 05/14/2022) 4 g 1   mupirocin ointment (BACTROBAN) 2 % Apply 1 application. topically 2 (two) times daily. (Patient not taking: Reported on 05/14/2022) 22 g 0   No facility-administered medications prior to visit.    Allergies  Allergen Reactions   Pregabalin Nausea Only    ROS Review of Systems  Constitutional:  Negative for chills and fever.  Eyes:  Negative for visual disturbance.  Respiratory:  Negative for chest tightness and shortness of breath.   Musculoskeletal:        Left knee pain  Neurological:  Negative for dizziness and headaches.      Objective:    Physical Exam HENT:     Head: Normocephalic.     Mouth/Throat:     Mouth: Mucous membranes are moist.  Cardiovascular:     Rate and Rhythm: Normal rate.      Heart sounds: Normal heart sounds.  Pulmonary:     Effort: Pulmonary effort is normal.     Breath sounds: Normal breath sounds.  Musculoskeletal:     Right lower leg: No edema.     Left lower leg: No edema.     Comments: Mild tenderness to palpation of the left hip and knee No swelling or signs of inflammation noted   Neurological:     Mental Status: She is alert.     BP 128/82   Pulse 80   Ht _0  (1.702 m)   Wt 197 lb 0.6 oz (89.4 kg)   SpO2 98%   BMI 30.86 kg/m  Wt Readings from Last 3 Encounters:  05/14/22 197 lb 0.6 oz (89.4 kg)  02/12/22 198 lb 1.3 oz (89.8 kg)  11/07/21 198 lb 9.6 oz (90.1 kg)    Lab Results  Component Value Date   TSH 1.080 11/07/2021   Lab Results  Component Value Date   WBC 3.5 11/07/2021   HGB 13.9 11/07/2021   HCT 40.3 11/07/2021   MCV 97 11/07/2021   PLT 232 11/07/2021   Lab Results  Component Value Date   NA 144 11/07/2021   K 3.6 11/07/2021   CO2 24 11/07/2021   GLUCOSE 88 11/07/2021   BUN 18 11/07/2021   CREATININE 0.99 11/07/2021   BILITOT 0.4 11/07/2021   ALKPHOS 60 11/07/2021   AST 14 11/07/2021   ALT 10 11/07/2021   PROT 7.1 11/07/2021   ALBUMIN 4.5 11/07/2021   CALCIUM 9.9 11/07/2021   ANIONGAP 9 09/21/2020   EGFR 57 (L) 11/07/2021   Lab Results  Component Value Date   CHOL 246 (H) 11/07/2021   Lab Results  Component Value Date   HDL 55 11/07/2021   Lab Results  Component Value Date   LDLCALC 166 (H) 11/07/2021   Lab Results  Component Value Date   TRIG 139 11/07/2021   Lab Results  Component Value Date   CHOLHDL 4.5 (H) 11/07/2021   Lab Results  Component Value Date   HGBA1C 5.4 11/07/2021      Assessment & Plan:  Primary osteoarthritis involving multiple joints Assessment & Plan: Depo-Medrol 40 mg IM injection given in the clinic Tylenol 500 mg every 6 hours is ordered and sent to the pharmacy and encouraged the patient to take as needed for pain Referral placed to orthopedic surgery  for steroid injections Encourage alternating with heat and cold application to the affected site  Orders: -     Ambulatory referral to Orthopedic Surgery -  methylPREDNISolone Acetate -     Acetaminophen; Take 1 tablet (500 mg total) by mouth every 6 (six) hours as needed.  Dispense: 30 tablet; Refill: 0  Arthritis -     Diclofenac Sodium; Apply 4 g topically 4 (four) times daily.  Dispense: 4 g; Refill: 1 -     Ambulatory referral to Orthopedic Surgery -     methylPREDNISolone Acetate  Carbuncle, neck -     Mupirocin; Apply 1 Application topically 2 (two) times daily.  Dispense: 22 g; Refill: 0  IFG (impaired fasting glucose) -     Hemoglobin A1c  Vitamin D deficiency -     VITAMIN D 25 Hydroxy (Vit-D Deficiency, Fractures)  Primary hypertension -     CMP14+EGFR -     CBC with Differential/Platelet  Hypothyroidism (acquired) -     TSH + free T4  Mixed hyperlipidemia -     Lipid panel    Follow-up: Return in about 3 months (around 08/13/2022).   Alvira Monday, FNP

## 2022-05-14 NOTE — Assessment & Plan Note (Signed)
Depo-Medrol 40 mg IM injection given in the clinic Tylenol 500 mg every 6 hours is ordered and sent to the pharmacy and encouraged the patient to take as needed for pain Referral placed to orthopedic surgery for steroid injections Encourage alternating with heat and cold application to the affected site

## 2022-05-14 NOTE — Patient Instructions (Addendum)
I appreciate the opportunity to provide care to you today!    Follow up:  3 months  Labs: please stop by the lab today to get your blood drawn (CBC, CMP, TSH, Lipid profile, HgA1c, Vit D)  Screening: HIV and Hep C  Please stop by [facility] hospital anytime to get an x-ray of the left hip and both hands  Please stop by your local pharmacy and get your Tdap and Shingles vaccine  Referrals today-  Orthopedics   Please continue to a heart-healthy diet and increase your physical activities. Try to exercise for 27mns at least three times a week.      It was a pleasure to see you and I look forward to continuing to work together on your health and well-being. Please do not hesitate to call the office if you need care or have questions about your care.   Have a wonderful day and week. With Gratitude, GAlvira MondayMSN, FNP-BC

## 2022-05-15 ENCOUNTER — Telehealth: Payer: Self-pay | Admitting: Family Medicine

## 2022-05-15 NOTE — Telephone Encounter (Signed)
Patient only received 1 of 3 prescriptions from 12/12   Patient still needs   diclofenac Sodium (VOLTAREN) 1 % GEL    acetaminophen (TYLENOL) 500 MG tablet

## 2022-05-15 NOTE — Telephone Encounter (Signed)
Spoke to the pharmacy, stated these medications can be bought otc, spoke to pt let her know, stated understanding.

## 2022-05-22 ENCOUNTER — Other Ambulatory Visit: Payer: Self-pay

## 2022-05-22 NOTE — Telephone Encounter (Signed)
Forms had not been given to provider, forms have been received today and will be given to provider.

## 2022-05-22 NOTE — Telephone Encounter (Signed)
Margaret care giver called asking when will these [employer] forms be ready to pick up.  A copy of forms are at front desk but not the original has made it back to the front desk for patient to pick up. Please contact patient

## 2022-05-24 ENCOUNTER — Other Ambulatory Visit: Payer: Self-pay | Admitting: Family Medicine

## 2022-05-24 DIAGNOSIS — Z0279 Encounter for issue of other medical certificate: Secondary | ICD-10-CM

## 2022-06-04 ENCOUNTER — Telehealth: Payer: Self-pay | Admitting: Family Medicine

## 2022-06-04 NOTE — Telephone Encounter (Signed)
Called in regard to [facility] forms.  Has not heard back in regard . Wants a call back to see if forms were faxed

## 2022-06-05 ENCOUNTER — Other Ambulatory Visit: Payer: Self-pay | Admitting: Family Medicine

## 2022-06-05 ENCOUNTER — Other Ambulatory Visit: Payer: Self-pay

## 2022-06-23 ENCOUNTER — Other Ambulatory Visit: Payer: Self-pay | Admitting: Family Medicine

## 2022-06-23 DIAGNOSIS — M10079 Idiopathic gout, unspecified ankle and foot: Secondary | ICD-10-CM

## 2022-07-09 ENCOUNTER — Telehealth: Payer: Self-pay | Admitting: Family Medicine

## 2022-07-09 ENCOUNTER — Other Ambulatory Visit: Payer: Self-pay

## 2022-07-09 DIAGNOSIS — M159 Polyosteoarthritis, unspecified: Secondary | ICD-10-CM

## 2022-07-09 DIAGNOSIS — E039 Hypothyroidism, unspecified: Secondary | ICD-10-CM

## 2022-07-09 DIAGNOSIS — E876 Hypokalemia: Secondary | ICD-10-CM

## 2022-07-09 DIAGNOSIS — E785 Hyperlipidemia, unspecified: Secondary | ICD-10-CM

## 2022-07-09 DIAGNOSIS — M10079 Idiopathic gout, unspecified ankle and foot: Secondary | ICD-10-CM

## 2022-07-09 DIAGNOSIS — I10 Essential (primary) hypertension: Secondary | ICD-10-CM

## 2022-07-09 MED ORDER — LEVOTHYROXINE SODIUM 100 MCG PO TABS: 100.0000 ug | ORAL_TABLET | Freq: Every day | ORAL | 0 refills | Status: DC

## 2022-07-09 MED ORDER — FUROSEMIDE 40 MG PO TABS: 20.0000 mg | ORAL_TABLET | Freq: Every day | ORAL | 1 refills | Status: AC

## 2022-07-09 MED ORDER — POTASSIUM CHLORIDE CRYS ER 20 MEQ PO TBCR: 40.0000 meq | EXTENDED_RELEASE_TABLET | Freq: Every day | ORAL | 2 refills | Status: DC

## 2022-07-09 MED ORDER — SIMVASTATIN 40 MG PO TABS: 40.0000 mg | ORAL_TABLET | Freq: Every day | ORAL | 2 refills | Status: DC

## 2022-07-09 MED ORDER — ALLOPURINOL 100 MG PO TABS: 100.0000 mg | ORAL_TABLET | Freq: Every day | ORAL | 1 refills | Status: AC

## 2022-07-09 MED ORDER — LISINOPRIL-HYDROCHLOROTHIAZIDE 20-25 MG PO TABS: 1.0000 | ORAL_TABLET | Freq: Every day | ORAL | 1 refills | Status: DC

## 2022-07-09 NOTE — Telephone Encounter (Signed)
Pt is forgetting to take her medications & is wanting to know if she can please get all of her medication prescribed by [place] sent to [place] so that they can put her meds in a bubble pill pack?   Also needs refills on all meds by [place] she is out.      [employer]

## 2022-07-09 NOTE — Telephone Encounter (Signed)
Refills sent, pt informed.

## 2022-07-19 ENCOUNTER — Telehealth: Payer: Self-pay | Admitting: Family Medicine

## 2022-07-19 NOTE — Telephone Encounter (Signed)
Spoke with patient.

## 2022-07-19 NOTE — Telephone Encounter (Signed)
Pt has questions medication that were prescribed    Joycelyn Schmid [phone]

## 2022-07-25 ENCOUNTER — Ambulatory Visit: Payer: Medicare HMO

## 2022-08-08 DIAGNOSIS — E782 Mixed hyperlipidemia: Secondary | ICD-10-CM | POA: Diagnosis not present

## 2022-08-08 DIAGNOSIS — R7301 Impaired fasting glucose: Secondary | ICD-10-CM | POA: Diagnosis not present

## 2022-08-08 DIAGNOSIS — E039 Hypothyroidism, unspecified: Secondary | ICD-10-CM | POA: Diagnosis not present

## 2022-08-08 DIAGNOSIS — I10 Essential (primary) hypertension: Secondary | ICD-10-CM | POA: Diagnosis not present

## 2022-08-08 DIAGNOSIS — E559 Vitamin D deficiency, unspecified: Secondary | ICD-10-CM | POA: Diagnosis not present

## 2022-08-09 ENCOUNTER — Other Ambulatory Visit: Payer: Self-pay | Admitting: Family Medicine

## 2022-08-09 DIAGNOSIS — R7989 Other specified abnormal findings of blood chemistry: Secondary | ICD-10-CM

## 2022-08-09 LAB — CBC WITH DIFFERENTIAL/PLATELET
Basophils Absolute: 0 10*3/uL (ref 0.0–0.2)
Basos: 1 %
EOS (ABSOLUTE): 0.1 10*3/uL (ref 0.0–0.4)
Eos: 1 %
Hematocrit: 36.4 % (ref 34.0–46.6)
Hemoglobin: 12.6 g/dL (ref 11.1–15.9)
Immature Grans (Abs): 0 10*3/uL (ref 0.0–0.1)
Immature Granulocytes: 0 %
Lymphocytes Absolute: 1.9 10*3/uL (ref 0.7–3.1)
Lymphs: 37 %
MCH: 32.7 pg (ref 26.6–33.0)
MCHC: 34.6 g/dL (ref 31.5–35.7)
MCV: 95 fL (ref 79–97)
Monocytes Absolute: 0.6 10*3/uL (ref 0.1–0.9)
Monocytes: 12 %
Neutrophils Absolute: 2.6 10*3/uL (ref 1.4–7.0)
Neutrophils: 49 %
Platelets: 190 10*3/uL (ref 150–450)
RBC: 3.85 x10E6/uL (ref 3.77–5.28)
RDW: 11.7 % (ref 11.7–15.4)
WBC: 5.2 10*3/uL (ref 3.4–10.8)

## 2022-08-09 LAB — CMP14+EGFR
ALT: 14 IU/L (ref 0–32)
AST: 19 IU/L (ref 0–40)
Albumin/Globulin Ratio: 2 (ref 1.2–2.2)
Albumin: 4.7 g/dL (ref 3.7–4.7)
Alkaline Phosphatase: 53 IU/L (ref 44–121)
BUN/Creatinine Ratio: 24 (ref 12–28)
BUN: 26 mg/dL (ref 8–27)
Bilirubin Total: 0.3 mg/dL (ref 0.0–1.2)
CO2: 21 mmol/L (ref 20–29)
Calcium: 10.5 mg/dL — ABNORMAL HIGH (ref 8.7–10.3)
Chloride: 103 mmol/L (ref 96–106)
Creatinine, Ser: 1.07 mg/dL — ABNORMAL HIGH (ref 0.57–1.00)
Globulin, Total: 2.4 g/dL (ref 1.5–4.5)
Glucose: 130 mg/dL — ABNORMAL HIGH (ref 70–99)
Potassium: 4.2 mmol/L (ref 3.5–5.2)
Sodium: 143 mmol/L (ref 134–144)
Total Protein: 7.1 g/dL (ref 6.0–8.5)
eGFR: 52 mL/min/{1.73_m2} — ABNORMAL LOW (ref >59)

## 2022-08-09 LAB — LIPID PANEL
Chol/HDL Ratio: 2.8 ratio (ref 0.0–4.4)
Cholesterol, Total: 150 mg/dL (ref 100–199)
HDL: 53 mg/dL (ref >39)
LDL Chol Calc (NIH): 81 mg/dL (ref 0–99)
Triglycerides: 87 mg/dL (ref 0–149)
VLDL Cholesterol Cal: 16 mg/dL (ref 5–40)

## 2022-08-09 LAB — TSH+FREE T4
Free T4: 2.13 ng/dL — ABNORMAL HIGH (ref 0.82–1.77)
TSH: 0.084 u[IU]/mL — ABNORMAL LOW (ref 0.450–4.500)

## 2022-08-09 LAB — VITAMIN D 25 HYDROXY (VIT D DEFICIENCY, FRACTURES): Vit D, 25-Hydroxy: 73.2 ng/mL (ref 30.0–100.0)

## 2022-08-09 LAB — HEMOGLOBIN A1C
Est. average glucose Bld gHb Est-mCnc: 117 mg/dL
Hgb A1c MFr Bld: 5.7 % — ABNORMAL HIGH (ref 4.8–5.6)

## 2022-08-09 MED ORDER — LEVOTHYROXINE SODIUM 88 MCG PO TABS: 88.0000 ug | ORAL_TABLET | Freq: Every day | ORAL | 3 refills | Status: DC

## 2022-08-09 NOTE — Progress Notes (Signed)
Please inform the patient that I have decreased her Synthroid from 100 mcg to 88 mcg daily. Her prescription is sent to the pharmacy so that she can start taking it. Her thyroid levels are slightly elevated. I want her to return for labs to assess her thyroid levels on September 20, 2022. Please inform the patient that she is pre-diabetic. I recommend avoiding simple carbohydrates, including cakes, sweet desserts, ice cream, soda (diet or regular), sweet tea, candies, chips, cookies, store-bought juices, alcohol in excess of 1-2 drinks a day, lemonade, artificial sweeteners, donuts, coffee creamers, and sugar-free products.  I recommend avoiding greasy, fatty foods with increased physical activity.  I recommend increasing her fluid intake to at least 64 ounces of water daily. Her calcium level is slightly elevated; please encourage the patient to be mindful of her calcium intake. We will continue to monitor. All other labs are stable

## 2022-08-12 ENCOUNTER — Telehealth: Payer: Self-pay | Admitting: Family Medicine

## 2022-08-12 NOTE — Telephone Encounter (Signed)
Pt caregiver informed of pt's lab results.

## 2022-08-12 NOTE — Telephone Encounter (Signed)
Returning lab result call. please call and explain in detail # [phone].

## 2022-08-13 ENCOUNTER — Telehealth: Payer: Self-pay | Admitting: Family Medicine

## 2022-08-13 ENCOUNTER — Other Ambulatory Visit: Payer: Self-pay

## 2022-08-13 ENCOUNTER — Encounter: Payer: Self-pay | Admitting: Family Medicine

## 2022-08-13 ENCOUNTER — Ambulatory Visit (INDEPENDENT_AMBULATORY_CARE_PROVIDER_SITE_OTHER): Payer: Medicare HMO | Admitting: Family Medicine

## 2022-08-13 VITALS — BP 138/84 | HR 85 | Ht 64.0 in | Wt 191.0 lb

## 2022-08-13 DIAGNOSIS — E039 Hypothyroidism, unspecified: Secondary | ICD-10-CM

## 2022-08-13 DIAGNOSIS — M25471 Effusion, right ankle: Secondary | ICD-10-CM | POA: Diagnosis not present

## 2022-08-13 DIAGNOSIS — I10 Essential (primary) hypertension: Secondary | ICD-10-CM | POA: Diagnosis not present

## 2022-08-13 DIAGNOSIS — R7989 Other specified abnormal findings of blood chemistry: Secondary | ICD-10-CM

## 2022-08-13 DIAGNOSIS — M25474 Effusion, right foot: Secondary | ICD-10-CM | POA: Diagnosis not present

## 2022-08-13 DIAGNOSIS — M25472 Effusion, left ankle: Secondary | ICD-10-CM

## 2022-08-13 DIAGNOSIS — M25475 Effusion, left foot: Secondary | ICD-10-CM

## 2022-08-13 MED ORDER — LEVOTHYROXINE SODIUM 88 MCG PO TABS: 88.0000 ug | ORAL_TABLET | Freq: Every day | ORAL | 3 refills | Status: DC

## 2022-08-13 NOTE — Telephone Encounter (Signed)
Pt med was sent to wrong phar. Can you please change her phar to [place]?   Please send levothyroxine (SYNTHROID) 88 MCG tablet  to [place] to be added to her medication bubble?

## 2022-08-13 NOTE — Assessment & Plan Note (Signed)
Informed the patient to start taking Synthroid 88 mcg daily Informed the patient that this continues Synthroid 100 mcg daily due to her recent labs Encouraged the patient to follow-up in 6 weeks to assess her thyroid levels No complaints or concerns reported today Lab Results  Component Value Date   TSH 0.084 (L) 08/08/2022

## 2022-08-13 NOTE — Patient Instructions (Signed)
I appreciate the opportunity to provide care to you today!    Follow up:  4 months  Labs:next visit  Leg swelling  Recommend conservative managements to help decrease swelling in your feet.   -Reduce salt (sodium) in your diet -- Sodium, which is found in table salt and processed foods, can worsen edema. Reducing the amount of salt you consume can help to reduce edema, especially if you also take a diuretic.  -Compression stockings -- Leg edema can be prevented and treated with the use of compression stockings. Stockings are available in several heights, including knee-high, thigh-high, and pantyhose. Knee-high stockings are sufficient for most patients. - Body positioning -- Leg, ankle, and foot edema can be improved by elevating the legs above heart level for 30 minutes three or four times per day. Elevating the legs may be sufficient to reduce or eliminate edema   Please continue to a heart-healthy diet and increase your physical activities. Try to exercise for 61mns at least five times a week.      It was a pleasure to see you and I look forward to continuing to work together on your health and well-being. Please do not hesitate to call the office if you need care or have questions about your care.   Have a wonderful day and week. With Gratitude, GAlvira MondayMSN, FNP-BC

## 2022-08-13 NOTE — Telephone Encounter (Signed)
Rx sent 

## 2022-08-13 NOTE — Progress Notes (Signed)
Established Patient Office Visit  Subjective:  Patient ID: Deborah Lane, female    DOB: 07/15/1940  Age: 82 y.o. MRN: [id number]  CC:  Chief Complaint  Patient presents with   Follow-up    Pt reports leg swelling, ongoing for a while now. Also wants to know results on thyroid.    HPI Deborah Lane is a 82 y.o. female with past medical history of Bilateral swelling of the feet and ankles, hypothyroidism, and hypertension and presents for f/u of  chronic medical conditions. For the details of today's visit, please refer to the assessment and plan.     Past Medical History:  Diagnosis Date   Colon cancer [facility])     Past Surgical History:  Procedure Laterality Date   COLON SURGERY      History reviewed. No pertinent family history.  Social History   Socioeconomic History   Marital status: Single    Spouse name: Not on file   Number of children: Not on file   Years of education: Not on file   Highest education level: Not on file  Occupational History   Not on file  Tobacco Use   Smoking status: Former   Smokeless tobacco: Never  Substance and Sexual Activity   Alcohol use: Not Currently   Drug use: Never   Sexual activity: Not on file  Other Topics Concern   Not on file  Social History Narrative   Not on file   Social Determinants of Health   Financial Resource Strain: Not on file  Food Insecurity: Not on file  Transportation Needs: Not on file  Physical Activity: Not on file  Stress: Not on file  Social Connections: Not on file  Intimate Partner Violence: Not on file    Outpatient Medications Prior to Visit  Medication Sig Dispense Refill   acetaminophen (TYLENOL) 500 MG tablet Take 1 tablet (500 mg total) by mouth every 6 (six) hours as needed. 30 tablet 0   allopurinol (ZYLOPRIM) 100 MG tablet Take 1 tablet (100 mg total) by mouth daily. 90 tablet 1   diazepam (VALIUM) 2 MG tablet Take 1 tablet (2 mg total) by mouth every 8 (eight) hours as needed  (dizziness). 10 tablet 0   diclofenac Sodium (VOLTAREN) 1 % GEL Apply 4 g topically 4 (four) times daily. 4 g 1   furosemide (LASIX) 40 MG tablet Take 0.5 tablets (20 mg total) by mouth daily. 30 tablet 1   levothyroxine (SYNTHROID) 88 MCG tablet Take 1 tablet (88 mcg total) by mouth daily. 90 tablet 3   lisinopril-hydrochlorothiazide (ZESTORETIC) 20-25 MG tablet Take 1 tablet by mouth daily. 90 tablet 1   mupirocin ointment (BACTROBAN) 2 % Apply 1 Application topically 2 (two) times daily. 22 g 0   polyethylene glycol (MIRALAX / GLYCOLAX) 17 g packet Take 17 g by mouth daily.     potassium chloride SA (KLOR-CON M) 20 MEQ tablet Take 2 tablets (40 mEq total) by mouth daily. 60 tablet 2   senna-docusate (SENOKOT-S) 8.6-50 MG tablet Take 1 tablet by mouth daily.     simvastatin (ZOCOR) 40 MG tablet Take 1 tablet (40 mg total) by mouth daily at 6 PM. 30 tablet 2   No facility-administered medications prior to visit.    Allergies  Allergen Reactions   Pregabalin Nausea Only    ROS Review of Systems  Constitutional:  Negative for chills and fever.  Eyes:  Negative for visual disturbance.  Respiratory:  Negative for chest tightness and  shortness of breath.   Cardiovascular:  Positive for leg swelling.  Neurological:  Negative for dizziness and headaches.      Objective:    Physical Exam HENT:     Head: Normocephalic.     Mouth/Throat:     Mouth: Mucous membranes are moist.  Cardiovascular:     Rate and Rhythm: Normal rate.     Heart sounds: Normal heart sounds.  Pulmonary:     Effort: Pulmonary effort is normal.     Breath sounds: Normal breath sounds.  Musculoskeletal:     Right ankle: Swelling present.     Left ankle: Swelling present.  Neurological:     Mental Status: She is alert.     BP 138/84   Pulse 85   Ht '5\' 4"'$  (1.626 m)   Wt 191 lb 0.6 oz (86.7 kg)   SpO2 98%   BMI 32.79 kg/m  Wt Readings from Last 3 Encounters:  08/13/22 191 lb 0.6 oz (86.7 kg)   05/14/22 197 lb 0.6 oz (89.4 kg)  02/12/22 198 lb 1.3 oz (89.8 kg)    Lab Results  Component Value Date   TSH 0.084 (L) 08/08/2022   Lab Results  Component Value Date   WBC 5.2 08/08/2022   HGB 12.6 08/08/2022   HCT 36.4 08/08/2022   MCV 95 08/08/2022   PLT 190 08/08/2022   Lab Results  Component Value Date   NA 143 08/08/2022   K 4.2 08/08/2022   CO2 21 08/08/2022   GLUCOSE 130 (H) 08/08/2022   BUN 26 08/08/2022   CREATININE 1.07 (H) 08/08/2022   BILITOT 0.3 08/08/2022   ALKPHOS 53 08/08/2022   AST 19 08/08/2022   ALT 14 08/08/2022   PROT 7.1 08/08/2022   ALBUMIN 4.7 08/08/2022   CALCIUM 10.5 (H) 08/08/2022   ANIONGAP 9 09/21/2020   EGFR 52 (L) 08/08/2022   Lab Results  Component Value Date   CHOL 150 08/08/2022   Lab Results  Component Value Date   HDL 53 08/08/2022   Lab Results  Component Value Date   LDLCALC 81 08/08/2022   Lab Results  Component Value Date   TRIG 87 08/08/2022   Lab Results  Component Value Date   CHOLHDL 2.8 08/08/2022   Lab Results  Component Value Date   HGBA1C 5.7 (H) 08/08/2022      Assessment & Plan:  Hypothyroidism (acquired) Assessment & Plan: Informed the patient to start taking Synthroid 88 mcg daily Informed the patient that this continues Synthroid 100 mcg daily due to her recent labs Encouraged the patient to follow-up in 6 weeks to assess her thyroid levels No complaints or concerns reported today Lab Results  Component Value Date   TSH 0.084 (L) 08/08/2022      Bilateral swelling of feet and ankles Assessment & Plan: -patient is complaint with diuretics treatments   -Recommend conservative managements to help decrease swelling in your  Feet and ankles.   -Reduce salt (sodium) in your diet -- Sodium, which is found in table salt and processed foods, can worsen edema. Reducing the amount of salt you consume can help to reduce edema, especially if you also take a diuretic.  -Compression stockings --  Leg edema can be prevented and treated with the use of compression stockings. Stockings are available in several heights, including knee-high, thigh-high, and pantyhose. Knee-high stockings are sufficient for most patients. - Body positioning -- Leg, ankle, and foot edema can be improved by elevating the legs above  heart level for 30 minutes three or four times per day. Elevating the legs may be sufficient to reduce or eliminate edema   Primary hypertension Assessment & Plan: Controlled She takes furosemide 20 mg daily, Zestoretic 20-25 mg daily and potassium chloride 40 MEQ daily She is asymptomatic today in the clinic Encouraged low-sodium diet with increased physical activity Encouraged to continue treatment regimen BP Readings from Last 3 Encounters:  08/13/22 138/84  05/14/22 128/82  02/12/22 (!) 151/86         Follow-up: Return in about 4 months (around 12/13/2022).   Alvira Monday, FNP

## 2022-08-13 NOTE — Assessment & Plan Note (Signed)
Controlled She takes furosemide 20 mg daily, Zestoretic 20-25 mg daily and potassium chloride 40 MEQ daily She is asymptomatic today in the clinic Encouraged low-sodium diet with increased physical activity Encouraged to continue treatment regimen BP Readings from Last 3 Encounters:  08/13/22 138/84  05/14/22 128/82  02/12/22 (!) 151/86

## 2022-08-13 NOTE — Assessment & Plan Note (Signed)
-  patient is complaint with diuretics treatments   -Recommend conservative managements to help decrease swelling in your  Feet and ankles.   -Reduce salt (sodium) in your diet -- Sodium, which is found in table salt and processed foods, can worsen edema. Reducing the amount of salt you consume can help to reduce edema, especially if you also take a diuretic.  -Compression stockings -- Leg edema can be prevented and treated with the use of compression stockings. Stockings are available in several heights, including knee-high, thigh-high, and pantyhose. Knee-high stockings are sufficient for most patients. - Body positioning -- Leg, ankle, and foot edema can be improved by elevating the legs above heart level for 30 minutes three or four times per day. Elevating the legs may be sufficient to reduce or eliminate edema 

## 2022-09-10 ENCOUNTER — Other Ambulatory Visit: Payer: Self-pay | Admitting: Family Medicine

## 2022-09-10 DIAGNOSIS — R7989 Other specified abnormal findings of blood chemistry: Secondary | ICD-10-CM

## 2022-09-14 ENCOUNTER — Other Ambulatory Visit: Payer: Self-pay | Admitting: Family Medicine

## 2022-09-14 DIAGNOSIS — I10 Essential (primary) hypertension: Secondary | ICD-10-CM

## 2022-09-24 ENCOUNTER — Telehealth: Payer: Self-pay | Admitting: Family Medicine

## 2022-09-24 NOTE — Telephone Encounter (Signed)
[place] [facility] called in regards to pt. She was wondering if a fax was received she sent a couple weeks ago. Contact Rosanna Randy [phone]

## 2022-09-25 ENCOUNTER — Other Ambulatory Visit: Payer: Self-pay | Admitting: Family Medicine

## 2022-09-25 DIAGNOSIS — E785 Hyperlipidemia, unspecified: Secondary | ICD-10-CM

## 2022-09-27 NOTE — Telephone Encounter (Signed)
Noted will look out for forms

## 2022-09-30 ENCOUNTER — Other Ambulatory Visit: Payer: Self-pay | Admitting: Family Medicine

## 2022-10-10 ENCOUNTER — Other Ambulatory Visit: Payer: Self-pay | Admitting: Family Medicine

## 2022-10-10 DIAGNOSIS — R7989 Other specified abnormal findings of blood chemistry: Secondary | ICD-10-CM

## 2022-10-24 DIAGNOSIS — R2243 Localized swelling, mass and lump, lower limb, bilateral: Secondary | ICD-10-CM | POA: Diagnosis not present

## 2022-10-24 DIAGNOSIS — M1A0690 Idiopathic chronic gout, unspecified knee, without tophus (tophi): Secondary | ICD-10-CM | POA: Diagnosis not present

## 2022-10-24 DIAGNOSIS — E039 Hypothyroidism, unspecified: Secondary | ICD-10-CM | POA: Diagnosis not present

## 2022-10-24 DIAGNOSIS — E7849 Other hyperlipidemia: Secondary | ICD-10-CM | POA: Diagnosis not present

## 2022-10-24 DIAGNOSIS — I10 Essential (primary) hypertension: Secondary | ICD-10-CM | POA: Diagnosis not present

## 2022-10-30 ENCOUNTER — Ambulatory Visit: Payer: Medicare HMO

## 2022-11-04 ENCOUNTER — Other Ambulatory Visit (HOSPITAL_COMMUNITY): Payer: Self-pay | Admitting: Internal Medicine

## 2022-11-04 ENCOUNTER — Ambulatory Visit (HOSPITAL_COMMUNITY)
Admission: RE | Admit: 2022-11-04 | Discharge: 2022-11-04 | Disposition: A | Discharge status: Home / Self Care | Payer: Medicare HMO | Source: Ambulatory Visit | Attending: Internal Medicine | Admitting: Internal Medicine

## 2022-11-04 ENCOUNTER — Other Ambulatory Visit (HOSPITAL_COMMUNITY)
Admission: RE | Admit: 2022-11-04 | Discharge: 2022-11-04 | Disposition: A | Discharge status: Home / Self Care | Payer: Medicare HMO | Source: Ambulatory Visit | Attending: Internal Medicine | Admitting: Internal Medicine

## 2022-11-04 DIAGNOSIS — M170 Bilateral primary osteoarthritis of knee: Secondary | ICD-10-CM | POA: Insufficient documentation

## 2022-11-04 DIAGNOSIS — M1712 Unilateral primary osteoarthritis, left knee: Secondary | ICD-10-CM | POA: Diagnosis not present

## 2022-11-04 DIAGNOSIS — M25561 Pain in right knee: Secondary | ICD-10-CM | POA: Insufficient documentation

## 2022-11-04 DIAGNOSIS — R251 Tremor, unspecified: Secondary | ICD-10-CM | POA: Diagnosis not present

## 2022-11-04 DIAGNOSIS — M1711 Unilateral primary osteoarthritis, right knee: Secondary | ICD-10-CM | POA: Diagnosis not present

## 2022-11-04 DIAGNOSIS — I10 Essential (primary) hypertension: Secondary | ICD-10-CM | POA: Diagnosis not present

## 2022-11-04 DIAGNOSIS — Z0001 Encounter for general adult medical examination with abnormal findings: Secondary | ICD-10-CM | POA: Diagnosis not present

## 2022-11-04 DIAGNOSIS — M25562 Pain in left knee: Secondary | ICD-10-CM | POA: Insufficient documentation

## 2022-11-04 LAB — CBC WITH DIFFERENTIAL/PLATELET
Abs Immature Granulocytes: 0.01 10*3/uL (ref 0.00–0.07)
Basophils Absolute: 0 10*3/uL (ref 0.0–0.1)
Basophils Relative: 0 %
Eosinophils Absolute: 0 10*3/uL (ref 0.0–0.5)
Eosinophils Relative: 0 %
HCT: 38.5 % (ref 36.0–46.0)
Hemoglobin: 12.5 g/dL (ref 12.0–15.0)
Immature Granulocytes: 0 %
Lymphocytes Relative: 29 %
Lymphs Abs: 1.5 10*3/uL (ref 0.7–4.0)
MCH: 32.1 pg (ref 26.0–34.0)
MCHC: 32.5 g/dL (ref 30.0–36.0)
MCV: 99 fL (ref 80.0–100.0)
Monocytes Absolute: 0.6 10*3/uL (ref 0.1–1.0)
Monocytes Relative: 12 %
Neutro Abs: 3.1 10*3/uL (ref 1.7–7.7)
Neutrophils Relative %: 59 %
Platelets: 237 10*3/uL (ref 150–400)
RBC: 3.89 MIL/uL (ref 3.87–5.11)
RDW: 13.2 % (ref 11.5–15.5)
WBC: 5.3 10*3/uL (ref 4.0–10.5)
nRBC: 0 % (ref 0.0–0.2)

## 2022-11-04 LAB — HEPATIC FUNCTION PANEL
ALT: 18 U/L (ref 0–44)
AST: 17 U/L (ref 15–41)
Albumin: 3.9 g/dL (ref 3.5–5.0)
Alkaline Phosphatase: 39 U/L (ref 38–126)
Bilirubin, Direct: <0.1 mg/dL (ref 0.0–0.2)
Total Bilirubin: 0.7 mg/dL (ref 0.3–1.2)
Total Protein: 7.2 g/dL (ref 6.5–8.1)

## 2022-11-04 LAB — LIPID PANEL
Cholesterol: 151 mg/dL (ref 0–200)
HDL: 47 mg/dL (ref >40)
LDL Cholesterol: 84 mg/dL (ref 0–99)
Total CHOL/HDL Ratio: 3.2 RATIO
Triglycerides: 101 mg/dL (ref <150)
VLDL: 20 mg/dL (ref 0–40)

## 2022-11-04 LAB — URIC ACID: Uric Acid, Serum: 5.9 mg/dL (ref 2.5–7.1)

## 2022-11-04 LAB — BASIC METABOLIC PANEL
Anion gap: 11 (ref 5–15)
BUN: 55 mg/dL — ABNORMAL HIGH (ref 8–23)
CO2: 26 mmol/L (ref 22–32)
Calcium: 9.8 mg/dL (ref 8.9–10.3)
Chloride: 101 mmol/L (ref 98–111)
Creatinine, Ser: 1.4 mg/dL — ABNORMAL HIGH (ref 0.44–1.00)
GFR, Estimated: 38 mL/min — ABNORMAL LOW (ref >60)
Glucose, Bld: 121 mg/dL — ABNORMAL HIGH (ref 70–99)
Potassium: 3.9 mmol/L (ref 3.5–5.1)
Sodium: 138 mmol/L (ref 135–145)

## 2022-11-04 LAB — T4, FREE: Free T4: 1.23 ng/dL — ABNORMAL HIGH (ref 0.61–1.12)

## 2022-11-04 LAB — TSH: TSH: 0.09 u[IU]/mL — ABNORMAL LOW (ref 0.350–4.500)

## 2022-11-07 DIAGNOSIS — E039 Hypothyroidism, unspecified: Secondary | ICD-10-CM | POA: Diagnosis not present

## 2022-11-07 DIAGNOSIS — M170 Bilateral primary osteoarthritis of knee: Secondary | ICD-10-CM | POA: Diagnosis not present

## 2022-11-07 DIAGNOSIS — I10 Essential (primary) hypertension: Secondary | ICD-10-CM | POA: Diagnosis not present

## 2022-11-07 DIAGNOSIS — E7849 Other hyperlipidemia: Secondary | ICD-10-CM | POA: Diagnosis not present

## 2022-11-11 ENCOUNTER — Other Ambulatory Visit: Payer: Self-pay | Admitting: Family Medicine

## 2022-11-11 DIAGNOSIS — R7989 Other specified abnormal findings of blood chemistry: Secondary | ICD-10-CM

## 2022-11-11 DIAGNOSIS — E785 Hyperlipidemia, unspecified: Secondary | ICD-10-CM

## 2022-11-11 DIAGNOSIS — E876 Hypokalemia: Secondary | ICD-10-CM

## 2022-11-14 DIAGNOSIS — M1A0690 Idiopathic chronic gout, unspecified knee, without tophus (tophi): Secondary | ICD-10-CM | POA: Diagnosis not present

## 2022-11-14 DIAGNOSIS — E7849 Other hyperlipidemia: Secondary | ICD-10-CM | POA: Diagnosis not present

## 2022-11-14 DIAGNOSIS — Z0001 Encounter for general adult medical examination with abnormal findings: Secondary | ICD-10-CM | POA: Diagnosis not present

## 2022-11-14 DIAGNOSIS — R54 Age-related physical debility: Secondary | ICD-10-CM | POA: Diagnosis not present

## 2022-11-14 DIAGNOSIS — M170 Bilateral primary osteoarthritis of knee: Secondary | ICD-10-CM | POA: Diagnosis not present

## 2022-11-14 DIAGNOSIS — I10 Essential (primary) hypertension: Secondary | ICD-10-CM | POA: Diagnosis not present

## 2022-11-14 DIAGNOSIS — Z1331 Encounter for screening for depression: Secondary | ICD-10-CM | POA: Diagnosis not present

## 2022-11-14 DIAGNOSIS — E039 Hypothyroidism, unspecified: Secondary | ICD-10-CM | POA: Diagnosis not present

## 2022-11-14 DIAGNOSIS — Z1389 Encounter for screening for other disorder: Secondary | ICD-10-CM | POA: Diagnosis not present

## 2022-11-19 ENCOUNTER — Other Ambulatory Visit (HOSPITAL_COMMUNITY)
Admission: RE | Admit: 2022-11-19 | Discharge: 2022-11-19 | Disposition: A | Discharge status: Home / Self Care | Payer: Medicare HMO | Source: Ambulatory Visit | Attending: Internal Medicine | Admitting: Internal Medicine

## 2022-11-19 DIAGNOSIS — R54 Age-related physical debility: Secondary | ICD-10-CM | POA: Diagnosis not present

## 2022-11-19 DIAGNOSIS — M170 Bilateral primary osteoarthritis of knee: Secondary | ICD-10-CM | POA: Diagnosis not present

## 2022-11-19 DIAGNOSIS — R251 Tremor, unspecified: Secondary | ICD-10-CM | POA: Diagnosis not present

## 2022-11-19 LAB — COMPREHENSIVE METABOLIC PANEL
ALT: 32 U/L (ref 0–44)
AST: 23 U/L (ref 15–41)
Albumin: 4 g/dL (ref 3.5–5.0)
Alkaline Phosphatase: 48 U/L (ref 38–126)
Anion gap: 11 (ref 5–15)
BUN: 87 mg/dL — ABNORMAL HIGH (ref 8–23)
CO2: 24 mmol/L (ref 22–32)
Calcium: 9.8 mg/dL (ref 8.9–10.3)
Chloride: 102 mmol/L (ref 98–111)
Creatinine, Ser: 2.9 mg/dL — ABNORMAL HIGH (ref 0.44–1.00)
GFR, Estimated: 16 mL/min — ABNORMAL LOW (ref >60)
Glucose, Bld: 128 mg/dL — ABNORMAL HIGH (ref 70–99)
Potassium: 5 mmol/L (ref 3.5–5.1)
Sodium: 137 mmol/L (ref 135–145)
Total Bilirubin: 0.9 mg/dL (ref 0.3–1.2)
Total Protein: 7.5 g/dL (ref 6.5–8.1)

## 2022-11-25 DIAGNOSIS — K8063 Calculus of gallbladder and bile duct with acute cholecystitis with obstruction: Secondary | ICD-10-CM | POA: Diagnosis not present

## 2022-11-25 DIAGNOSIS — R6521 Severe sepsis with septic shock: Secondary | ICD-10-CM | POA: Diagnosis not present

## 2022-11-25 DIAGNOSIS — Z1152 Encounter for screening for COVID-19: Secondary | ICD-10-CM | POA: Diagnosis not present

## 2022-11-25 DIAGNOSIS — R609 Edema, unspecified: Secondary | ICD-10-CM | POA: Diagnosis not present

## 2022-11-25 DIAGNOSIS — N179 Acute kidney failure, unspecified: Secondary | ICD-10-CM | POA: Diagnosis not present

## 2022-11-25 DIAGNOSIS — Z20822 Contact with and (suspected) exposure to covid-19: Secondary | ICD-10-CM | POA: Diagnosis not present

## 2022-11-25 DIAGNOSIS — M1990 Unspecified osteoarthritis, unspecified site: Secondary | ICD-10-CM | POA: Diagnosis not present

## 2022-11-25 DIAGNOSIS — M797 Fibromyalgia: Secondary | ICD-10-CM | POA: Diagnosis not present

## 2022-11-25 DIAGNOSIS — G9341 Metabolic encephalopathy: Secondary | ICD-10-CM | POA: Diagnosis not present

## 2022-11-25 DIAGNOSIS — E278 Other specified disorders of adrenal gland: Secondary | ICD-10-CM | POA: Diagnosis not present

## 2022-11-25 DIAGNOSIS — E559 Vitamin D deficiency, unspecified: Secondary | ICD-10-CM | POA: Diagnosis not present

## 2022-11-25 DIAGNOSIS — I959 Hypotension, unspecified: Secondary | ICD-10-CM | POA: Diagnosis not present

## 2022-11-25 DIAGNOSIS — J9601 Acute respiratory failure with hypoxia: Secondary | ICD-10-CM | POA: Diagnosis not present

## 2022-11-25 DIAGNOSIS — K5900 Constipation, unspecified: Secondary | ICD-10-CM | POA: Diagnosis not present

## 2022-11-25 DIAGNOSIS — I129 Hypertensive chronic kidney disease with stage 1 through stage 4 chronic kidney disease, or unspecified chronic kidney disease: Secondary | ICD-10-CM | POA: Diagnosis not present

## 2022-11-25 DIAGNOSIS — D3502 Benign neoplasm of left adrenal gland: Secondary | ICD-10-CM | POA: Diagnosis not present

## 2022-11-25 DIAGNOSIS — N170 Acute kidney failure with tubular necrosis: Secondary | ICD-10-CM | POA: Diagnosis not present

## 2022-11-25 DIAGNOSIS — E039 Hypothyroidism, unspecified: Secondary | ICD-10-CM | POA: Diagnosis not present

## 2022-11-25 DIAGNOSIS — K810 Acute cholecystitis: Secondary | ICD-10-CM | POA: Diagnosis not present

## 2022-11-25 DIAGNOSIS — C19 Malignant neoplasm of rectosigmoid junction: Secondary | ICD-10-CM | POA: Diagnosis not present

## 2022-11-25 DIAGNOSIS — E782 Mixed hyperlipidemia: Secondary | ICD-10-CM | POA: Diagnosis not present

## 2022-11-25 DIAGNOSIS — R339 Retention of urine, unspecified: Secondary | ICD-10-CM | POA: Diagnosis not present

## 2022-11-25 DIAGNOSIS — I21A1 Myocardial infarction type 2: Secondary | ICD-10-CM | POA: Diagnosis not present

## 2022-11-25 DIAGNOSIS — A419 Sepsis, unspecified organism: Secondary | ICD-10-CM | POA: Diagnosis not present

## 2022-11-25 DIAGNOSIS — I3481 Nonrheumatic mitral (valve) annulus calcification: Secondary | ICD-10-CM | POA: Diagnosis not present

## 2022-11-25 DIAGNOSIS — R531 Weakness: Secondary | ICD-10-CM | POA: Diagnosis not present

## 2022-11-25 DIAGNOSIS — I469 Cardiac arrest, cause unspecified: Secondary | ICD-10-CM | POA: Diagnosis not present

## 2022-11-25 DIAGNOSIS — I700 Atherosclerosis of aorta: Secondary | ICD-10-CM | POA: Diagnosis not present

## 2022-11-25 DIAGNOSIS — N1831 Chronic kidney disease, stage 3a: Secondary | ICD-10-CM | POA: Diagnosis not present

## 2022-11-25 DIAGNOSIS — E6609 Other obesity due to excess calories: Secondary | ICD-10-CM | POA: Diagnosis not present

## 2022-11-26 DIAGNOSIS — R6521 Severe sepsis with septic shock: Secondary | ICD-10-CM | POA: Diagnosis not present

## 2022-11-26 DIAGNOSIS — C189 Malignant neoplasm of colon, unspecified: Secondary | ICD-10-CM | POA: Diagnosis not present

## 2022-11-26 DIAGNOSIS — N179 Acute kidney failure, unspecified: Secondary | ICD-10-CM | POA: Diagnosis not present

## 2022-11-26 DIAGNOSIS — N1831 Chronic kidney disease, stage 3a: Secondary | ICD-10-CM | POA: Diagnosis not present

## 2022-11-26 DIAGNOSIS — E782 Mixed hyperlipidemia: Secondary | ICD-10-CM | POA: Diagnosis not present

## 2022-11-26 DIAGNOSIS — K2900 Acute gastritis without bleeding: Secondary | ICD-10-CM | POA: Diagnosis not present

## 2022-11-26 DIAGNOSIS — I959 Hypotension, unspecified: Secondary | ICD-10-CM | POA: Diagnosis not present

## 2022-11-26 DIAGNOSIS — A419 Sepsis, unspecified organism: Secondary | ICD-10-CM | POA: Diagnosis not present

## 2022-11-26 DIAGNOSIS — I10 Essential (primary) hypertension: Secondary | ICD-10-CM | POA: Diagnosis not present

## 2022-11-26 DIAGNOSIS — Z6830 Body mass index (BMI) 30.0-30.9, adult: Secondary | ICD-10-CM | POA: Diagnosis not present

## 2022-11-26 DIAGNOSIS — K810 Acute cholecystitis: Secondary | ICD-10-CM | POA: Diagnosis not present

## 2022-11-26 DIAGNOSIS — E669 Obesity, unspecified: Secondary | ICD-10-CM | POA: Diagnosis not present

## 2022-12-13 ENCOUNTER — Ambulatory Visit: Payer: Medicare HMO | Admitting: Family Medicine

## 2023-01-22 ENCOUNTER — Ambulatory Visit: Payer: Medicare HMO
